# Patient Record
Sex: Female | Born: 1940
Health system: Southern US, Community
[De-identification: ages and names within clinical notes are randomized; demographics above are authoritative.]

## PROBLEM LIST (undated history)

## (undated) DIAGNOSIS — M5386 Other specified dorsopathies, lumbar region: Secondary | ICD-10-CM

## (undated) DIAGNOSIS — Z8619 Personal history of other infectious and parasitic diseases: Secondary | ICD-10-CM

## (undated) DIAGNOSIS — D649 Anemia, unspecified: Secondary | ICD-10-CM

## (undated) DIAGNOSIS — M1990 Unspecified osteoarthritis, unspecified site: Secondary | ICD-10-CM

## (undated) DIAGNOSIS — Z860100 Personal history of colon polyps, unspecified: Secondary | ICD-10-CM

## (undated) DIAGNOSIS — C649 Malignant neoplasm of unspecified kidney, except renal pelvis: Secondary | ICD-10-CM

## (undated) DIAGNOSIS — H269 Unspecified cataract: Secondary | ICD-10-CM

## (undated) DIAGNOSIS — M8580 Other specified disorders of bone density and structure, unspecified site: Secondary | ICD-10-CM

## (undated) HISTORY — DX: Other specified disorders of bone density and structure, unspecified site: M85.80

## (undated) HISTORY — DX: Malignant neoplasm of unspecified kidney, except renal pelvis: C64.9

## (undated) HISTORY — PX: TONSILLECTOMY: SUR1361

## (undated) HISTORY — PX: FRACTURE SURGERY: SHX138

## (undated) HISTORY — PX: TUBAL LIGATION: SHX77

## (undated) HISTORY — DX: Unspecified cataract: H26.9

## (undated) HISTORY — PX: DIAGNOSTIC LAPAROSCOPY: SUR761

---

## 2000-04-29 ENCOUNTER — Encounter: Admission: RE | Admit: 2000-04-29 | Discharge: 2000-04-29 | Payer: Self-pay | Admitting: Obstetrics and Gynecology

## 2000-04-29 ENCOUNTER — Encounter: Payer: Self-pay | Admitting: Obstetrics and Gynecology

## 2005-06-21 ENCOUNTER — Ambulatory Visit: Payer: Self-pay | Admitting: Family Medicine

## 2005-07-04 ENCOUNTER — Ambulatory Visit: Payer: Self-pay | Admitting: Family Medicine

## 2005-07-13 ENCOUNTER — Ambulatory Visit: Payer: Self-pay | Admitting: Family Medicine

## 2005-08-03 ENCOUNTER — Ambulatory Visit: Payer: Self-pay | Admitting: Family Medicine

## 2006-01-21 ENCOUNTER — Ambulatory Visit: Payer: Self-pay | Admitting: Unknown Physician Specialty

## 2006-12-25 ENCOUNTER — Ambulatory Visit: Payer: Self-pay | Admitting: Family Medicine

## 2006-12-25 LAB — CONVERTED CEMR LAB
ALT: 19 units/L (ref 0–40)
AST: 27 units/L (ref 0–37)
Albumin: 4.2 g/dL (ref 3.5–5.2)
Alkaline Phosphatase: 64 units/L (ref 39–117)
BUN: 19 mg/dL (ref 6–23)
Basophils Absolute: 0 10*3/uL (ref 0.0–0.1)
Basophils Relative: 0.2 % (ref 0.0–1.0)
Bilirubin, Direct: 0.1 mg/dL (ref 0.0–0.3)
CO2: 30 meq/L (ref 19–32)
Calcium: 9.4 mg/dL (ref 8.4–10.5)
Chloride: 99 meq/L (ref 96–112)
Cholesterol: 225 mg/dL (ref 0–200)
Creatinine, Ser: 0.9 mg/dL (ref 0.4–1.2)
Direct LDL: 121.1 mg/dL
Eosinophils Absolute: 0.2 10*3/uL (ref 0.0–0.6)
Eosinophils Relative: 3 % (ref 0.0–5.0)
GFR calc Af Amer: 81 mL/min
GFR calc non Af Amer: 67 mL/min
Glucose, Bld: 91 mg/dL (ref 70–99)
HCT: 39.4 % (ref 36.0–46.0)
HDL: 81.3 mg/dL (ref >39.0)
Hemoglobin: 13.4 g/dL (ref 12.0–15.0)
Lymphocytes Relative: 38.2 % (ref 12.0–46.0)
MCHC: 34 g/dL (ref 30.0–36.0)
MCV: 93.7 fL (ref 78.0–100.0)
Monocytes Absolute: 0.7 10*3/uL (ref 0.2–0.7)
Monocytes Relative: 9.2 % (ref 3.0–11.0)
Neutro Abs: 3.6 10*3/uL (ref 1.4–7.7)
Neutrophils Relative %: 49.4 % (ref 43.0–77.0)
Platelets: 391 10*3/uL (ref 150–400)
Potassium: 4.2 meq/L (ref 3.5–5.1)
RBC: 4.21 M/uL (ref 3.87–5.11)
RDW: 11.2 % — ABNORMAL LOW (ref 11.5–14.6)
Sodium: 138 meq/L (ref 135–145)
TSH: 1.84 microintl units/mL (ref 0.35–5.50)
Total Bilirubin: 1.1 mg/dL (ref 0.3–1.2)
Total CHOL/HDL Ratio: 2.8
Total Protein: 6.8 g/dL (ref 6.0–8.3)
Triglycerides: 51 mg/dL (ref 0–149)
VLDL: 10 mg/dL (ref 0–40)
WBC: 7.3 10*3/uL (ref 4.5–10.5)

## 2007-04-02 ENCOUNTER — Encounter (INDEPENDENT_AMBULATORY_CARE_PROVIDER_SITE_OTHER): Payer: Self-pay | Admitting: *Deleted

## 2008-03-29 ENCOUNTER — Encounter: Payer: Self-pay | Admitting: Family Medicine

## 2008-04-02 ENCOUNTER — Encounter (INDEPENDENT_AMBULATORY_CARE_PROVIDER_SITE_OTHER): Payer: Self-pay | Admitting: *Deleted

## 2009-04-01 ENCOUNTER — Encounter: Payer: Self-pay | Admitting: Family Medicine

## 2009-04-12 ENCOUNTER — Encounter: Payer: Self-pay | Admitting: Family Medicine

## 2009-04-13 ENCOUNTER — Encounter (INDEPENDENT_AMBULATORY_CARE_PROVIDER_SITE_OTHER): Payer: Self-pay | Admitting: *Deleted

## 2010-05-02 ENCOUNTER — Encounter: Payer: Self-pay | Admitting: Family Medicine

## 2010-05-05 ENCOUNTER — Encounter: Payer: Self-pay | Admitting: Family Medicine

## 2010-11-14 NOTE — Letter (Signed)
Summary: Results Follow up Letter  [place] [facility]  [address] [place], [place] [address]   Phone: [phone]  Fax: [phone]    05/05/2010 MRN: [id number]    MARITSSA HAUGHTON [address] [place], [place]  [address]    Dear Ms. Pischke,  The following are the results of your recent test(s):  Test         Result    Pap Smear:        Normal _____  Not Normal _____ Comments: ______________________________________________________ Cholesterol: LDL(Bad cholesterol):         Your goal is less than:         HDL (Good cholesterol):       Your goal is more than: Comments:  ______________________________________________________ Mammogram:        Normal ___X__  Not Normal _____ Comments:Repeat in one year.   ___________________________________________________________________ Hemoccult:        Normal _____  Not normal _______ Comments:    _____________________________________________________________________ Other Tests:    We routinely do not discuss normal results over the telephone.  If you desire a copy of the results, or you have any questions about this information we can discuss them at your next office visit.   Sincerely,    Idamae Schuller Tower,MD  MT/ri

## 2010-11-14 NOTE — Miscellaneous (Signed)
Summary: mammogram screening  Clinical Lists Changes  Observations: Added new observation of MAMMO DUE: 05/2011 (05/05/2010 10:45) Added new observation of MAMMOGRAM: normal (05/02/2010 10:46)      Preventive Care Screening  Mammogram:    Date:  05/02/2010    Next Due:  05/2011    Results:  normal

## 2011-04-26 ENCOUNTER — Ambulatory Visit: Payer: Self-pay | Admitting: Unknown Physician Specialty

## 2011-04-27 LAB — PATHOLOGY REPORT

## 2011-07-03 LAB — HM MAMMOGRAPHY: HM Mammogram: NORMAL

## 2011-07-04 ENCOUNTER — Encounter: Payer: Self-pay | Admitting: Family Medicine

## 2011-07-09 ENCOUNTER — Encounter: Payer: Self-pay | Admitting: Family Medicine

## 2011-07-20 ENCOUNTER — Encounter: Payer: Self-pay | Admitting: Family Medicine

## 2011-07-23 ENCOUNTER — Ambulatory Visit: Payer: Self-pay | Admitting: Family Medicine

## 2011-07-30 ENCOUNTER — Encounter: Payer: Self-pay | Admitting: Family Medicine

## 2011-07-30 ENCOUNTER — Ambulatory Visit: Payer: Self-pay | Admitting: Family Medicine

## 2011-07-31 ENCOUNTER — Ambulatory Visit (INDEPENDENT_AMBULATORY_CARE_PROVIDER_SITE_OTHER): Payer: Medicare Other | Admitting: Family Medicine

## 2011-07-31 ENCOUNTER — Encounter: Payer: Self-pay | Admitting: Family Medicine

## 2011-07-31 VITALS — BP 124/64 | HR 64 | Temp 97.6°F | Ht 60.0 in | Wt 112.8 lb

## 2011-07-31 DIAGNOSIS — M899 Disorder of bone, unspecified: Secondary | ICD-10-CM

## 2011-07-31 DIAGNOSIS — M8580 Other specified disorders of bone density and structure, unspecified site: Secondary | ICD-10-CM | POA: Insufficient documentation

## 2011-07-31 DIAGNOSIS — Z23 Encounter for immunization: Secondary | ICD-10-CM

## 2011-07-31 MED ORDER — ALENDRONATE SODIUM 70 MG PO TABS: 70.0000 mg | ORAL_TABLET | ORAL | Status: DC

## 2011-07-31 NOTE — Assessment & Plan Note (Signed)
Long disc about osteopenia and tx options and fracture risk  Pt given handout on OP  Has fam hx/ petite frame/ caucasian - but no hx of fx Will inc ca plus D to bid  Check D level in future  Start fosamax weekly- disc poss side eff in detail and will update dexa in 2 y  Disc safety issues   (also flu shot today)

## 2011-07-31 NOTE — Progress Notes (Signed)
  Subjective:    Patient ID: Christina Henry, female    DOB: 1941-04-18, 70 y.o.   MRN: [id number]  HPI Here to disc dexa and osteopenia  Had lowest score on dexa as -1.9 of R FN , and this was prev -1.7 Report does not give T scores for spine but states that bone density is down 8.2 % in spine   Is petite and caucasian   Ca- is very good with that 600 mg   D - takes 400 iu of that  Tried to take more - and stopped  Is outdoors a lot   Exercise - has exercised all her life - walking and golfing and raking leaves   fam hx - sister may have OP  fx hx - no fx at all in her life   She does have stiffness in back and hips lately - this does not slow her down  occ aleve   No GI problems  No hx of jaw tumors  Had a crown put on - no major dental work   Will be following up in [place] - for her PE  Pap-- since Dr Elana Alm retired  Brought old records to review   Patient Active Problem List  Diagnoses  . Osteopenia   Past Medical History  Diagnosis Date  . Osteopenia    No past surgical history on file. History  Substance Use Topics  . Smoking status: Never Smoker   . Smokeless tobacco: Not on file  . Alcohol Use: Not on file   No family history on file. Allergies  Allergen Reactions  . Demerol Nausea Only   No current outpatient prescriptions on file prior to visit.        Review of Systems Review of Systems  Constitutional: Negative for fever, appetite change, fatigue and unexpected weight change.  Eyes: Negative for pain and visual disturbance.  Respiratory: Negative for cough and shortness of breath.   Cardiovascular: Negative for cp or palpitations    Gastrointestinal: Negative for nausea, diarrhea and constipation.  Genitourinary: Negative for urgency and frequency.  Skin: Negative for pallor or rash   Neurological: Negative for weakness, light-headedness, numbness and headaches.  Hematological: Negative for adenopathy. Does not bruise/bleed easily.    Psychiatric/Behavioral: Negative for dysphoric mood. The patient is not nervous/anxious.          Objective:   Physical Exam  Constitutional: She appears well-developed and well-nourished. No distress.  HENT:  Head: Normocephalic and atraumatic.  Mouth/Throat: Oropharynx is clear and moist.  Eyes: Conjunctivae and EOM are normal. Pupils are equal, round, and reactive to light.  Neck: Normal range of motion. Neck supple. No thyromegaly present.  Cardiovascular: Normal rate, regular rhythm and normal heart sounds.   Pulmonary/Chest: Effort normal and breath sounds normal.  Musculoskeletal: She exhibits no edema.       No kyphosis or acute joint changes   Lymphadenopathy:    She has no cervical adenopathy.  Neurological: She is alert. She has normal reflexes. Coordination normal.  Skin: Skin is warm and dry. No rash noted. No erythema. No pallor.  Psychiatric: She has a normal mood and affect.          Assessment & Plan:

## 2011-07-31 NOTE — Patient Instructions (Addendum)
Start fosamax once weekly If any problems with it - like reflux stop it and call to let me know Increase your ca plus D to twice daily Keep exercising !

## 2011-08-07 ENCOUNTER — Encounter: Payer: Self-pay | Admitting: Family Medicine

## 2011-10-31 ENCOUNTER — Telehealth: Payer: Self-pay | Admitting: Family Medicine

## 2011-10-31 ENCOUNTER — Other Ambulatory Visit (INDEPENDENT_AMBULATORY_CARE_PROVIDER_SITE_OTHER): Payer: Medicare Other

## 2011-10-31 DIAGNOSIS — M899 Disorder of bone, unspecified: Secondary | ICD-10-CM

## 2011-10-31 DIAGNOSIS — M949 Disorder of cartilage, unspecified: Secondary | ICD-10-CM

## 2011-10-31 DIAGNOSIS — Z79899 Other long term (current) drug therapy: Secondary | ICD-10-CM

## 2011-10-31 DIAGNOSIS — Z0000 Encounter for general adult medical examination without abnormal findings: Secondary | ICD-10-CM

## 2011-10-31 DIAGNOSIS — M8580 Other specified disorders of bone density and structure, unspecified site: Secondary | ICD-10-CM

## 2011-10-31 LAB — LDL CHOLESTEROL, DIRECT: Direct LDL: 117.8 mg/dL

## 2011-10-31 LAB — LIPID PANEL
Cholesterol: 228 mg/dL — ABNORMAL HIGH (ref 0–200)
HDL: 94.9 mg/dL
Total CHOL/HDL Ratio: 2
Triglycerides: 70 mg/dL (ref 0.0–149.0)
VLDL: 14 mg/dL (ref 0.0–40.0)

## 2011-10-31 LAB — CBC WITH DIFFERENTIAL/PLATELET
Basophils Absolute: 0 K/uL (ref 0.0–0.1)
Basophils Relative: 0.7 % (ref 0.0–3.0)
Eosinophils Absolute: 0.3 K/uL (ref 0.0–0.7)
Eosinophils Relative: 4.3 % (ref 0.0–5.0)
HCT: 39.2 % (ref 36.0–46.0)
Hemoglobin: 13.3 g/dL (ref 12.0–15.0)
Lymphocytes Relative: 35 % (ref 12.0–46.0)
Lymphs Abs: 2.5 K/uL (ref 0.7–4.0)
MCHC: 33.9 g/dL (ref 30.0–36.0)
MCV: 92.1 fl (ref 78.0–100.0)
Monocytes Absolute: 0.6 K/uL (ref 0.1–1.0)
Monocytes Relative: 9.1 % (ref 3.0–12.0)
Neutro Abs: 3.6 K/uL (ref 1.4–7.7)
Neutrophils Relative %: 50.9 % (ref 43.0–77.0)
Platelets: 293 K/uL (ref 150.0–400.0)
RBC: 4.25 Mil/uL (ref 3.87–5.11)
RDW: 12.7 % (ref 11.5–14.6)
WBC: 7.1 K/uL (ref 4.5–10.5)

## 2011-10-31 LAB — COMPREHENSIVE METABOLIC PANEL WITH GFR
ALT: 17 U/L (ref 0–35)
AST: 20 U/L (ref 0–37)
Albumin: 4.7 g/dL (ref 3.5–5.2)
Alkaline Phosphatase: 52 U/L (ref 39–117)
BUN: 19 mg/dL (ref 6–23)
CO2: 28 meq/L (ref 19–32)
Calcium: 9.7 mg/dL (ref 8.4–10.5)
Chloride: 103 meq/L (ref 96–112)
Creatinine, Ser: 0.9 mg/dL (ref 0.4–1.2)
GFR: 63.32 mL/min
Glucose, Bld: 93 mg/dL (ref 70–99)
Potassium: 4.6 meq/L (ref 3.5–5.1)
Sodium: 139 meq/L (ref 135–145)
Total Bilirubin: 0.8 mg/dL (ref 0.3–1.2)
Total Protein: 7.3 g/dL (ref 6.0–8.3)

## 2011-10-31 LAB — TSH: TSH: 2.51 u[IU]/mL (ref 0.35–5.50)

## 2011-10-31 NOTE — Telephone Encounter (Signed)
Message copied by Judy Pimple on Wed Oct 31, 2011  8:06 AM ------      Message from: Baldomero Lamy      Created: Wed Oct 24, 2011  8:11 AM      Regarding: cpx labs Wed 10/31/11       Please order  future cpx labs for pt's upcomming lab appt.      Thanks      Rodney Booze

## 2011-10-31 NOTE — Progress Notes (Signed)
Addended by: Baldomero Lamy on: 10/31/2011 08:33 AM   Modules accepted: Orders

## 2011-11-07 ENCOUNTER — Ambulatory Visit (INDEPENDENT_AMBULATORY_CARE_PROVIDER_SITE_OTHER): Payer: Medicare Other | Admitting: Family Medicine

## 2011-11-07 ENCOUNTER — Encounter: Payer: Self-pay | Admitting: Family Medicine

## 2011-11-07 VITALS — BP 134/68 | HR 72 | Temp 98.3°F | Ht 59.75 in | Wt 114.8 lb

## 2011-11-07 DIAGNOSIS — M949 Disorder of cartilage, unspecified: Secondary | ICD-10-CM

## 2011-11-07 DIAGNOSIS — Z23 Encounter for immunization: Secondary | ICD-10-CM

## 2011-11-07 DIAGNOSIS — M899 Disorder of bone, unspecified: Secondary | ICD-10-CM

## 2011-11-07 DIAGNOSIS — M8580 Other specified disorders of bone density and structure, unspecified site: Secondary | ICD-10-CM

## 2011-11-07 DIAGNOSIS — Z0000 Encounter for general adult medical examination without abnormal findings: Secondary | ICD-10-CM

## 2011-11-07 NOTE — Progress Notes (Signed)
Subjective:    Patient ID: Christina Henry, female    DOB: 02-24-1941, 71 y.o.   MRN: [id number]  HPI Here for check up of chronic medical conditions and to review health mt list   Is feeling good  Decided to quit taking the generic fosamax   Wt is up 2 lb with bmi of 22  134/68 stable bp   Osteopenia  dexa 9/12 - decrease in spine score - hip stable  On fosamax- then stopped it  Due to fear of side effects , was on it for 2 months  Lumbar spine score did go down  Is exercising some - got her silver sneakers card - and is going today to start it  Vit D level good at 43  Lipids ok Lab Results  Component Value Date   CHOL 228* 10/31/2011   HDL 94.90 10/31/2011   LDLDIRECT 117.8 10/31/2011   TRIG 70.0 10/31/2011   CHOLHDL 2 10/31/2011    Pap was 8/11 normal No gyn problems  No hpv  No new partners   Mam 9/12 normal Self exam - no lumps or problems   Tdap - ? Did at health dept -- over 10 years ago , will go to health dept  Zoster status- had shingles back in 1990s , light case  Pneumovax - wants to get today  Had flu shot  colonosc 7/12- came out ok   Patient Active Problem List  Diagnoses  . Osteopenia  . Routine general medical examination at a health care facility   Past Medical History  Diagnosis Date  . Osteopenia    No past surgical history on file. History  Substance Use Topics  . Smoking status: Never Smoker   . Smokeless tobacco: Not on file  . Alcohol Use: Not on file   No family history on file. Allergies  Allergen Reactions  . Demerol Nausea Only   Current Outpatient Prescriptions on File Prior to Visit  Medication Sig Dispense Refill  . alendronate (FOSAMAX) 70 MG tablet Take 1 tablet (70 mg total) by mouth every 7 (seven) days. Take with a full glass of water on an empty stomach.  4 tablet  11  . Calcium Carbonate-Vit D-Min 600-400 MG-UNIT TABS Take 1 tablet by mouth 2 (two) times daily.           Review of Systems Review of Systems    Constitutional: Negative for fever, appetite change, fatigue and unexpected weight change.  Eyes: Negative for pain and visual disturbance.  Respiratory: Negative for cough and shortness of breath.   Cardiovascular: Negative for cp or palpitations    Gastrointestinal: Negative for nausea, diarrhea and constipation.  Genitourinary: Negative for urgency and frequency.  Skin: Negative for pallor or rash   Neurological: Negative for weakness, light-headedness, numbness and headaches.  Hematological: Negative for adenopathy. Does not bruise/bleed easily.  Psychiatric/Behavioral: Negative for dysphoric mood. The patient is not nervous/anxious.          Objective:   Physical Exam  Constitutional: She appears well-developed and well-nourished. No distress.  HENT:  Head: Normocephalic and atraumatic.  Right Ear: External ear normal.  Left Ear: External ear normal.  Nose: Nose normal.  Mouth/Throat: Oropharynx is clear and moist.  Eyes: Conjunctivae and EOM are normal. Pupils are equal, round, and reactive to light. No scleral icterus.  Neck: Normal range of motion. Neck supple. No JVD present. Carotid bruit is not present. No thyromegaly present.  Cardiovascular: Normal rate, regular rhythm, normal heart  sounds and intact distal pulses.  Exam reveals no gallop.   Pulmonary/Chest: Effort normal and breath sounds normal. No respiratory distress. She has no wheezes.  Abdominal: Soft. Bowel sounds are normal. She exhibits no distension, no abdominal bruit and no mass. There is no tenderness.  Genitourinary: No breast swelling, tenderness, discharge or bleeding.       Breast exam: No mass, nodules, thickening, tenderness, bulging, retraction, inflamation, nipple discharge or skin changes noted.  No axillary or clavicular LA.  Chaperoned exam.    Musculoskeletal: Normal range of motion. She exhibits no edema and no tenderness.       No kyphosis   Lymphadenopathy:    She has no cervical  adenopathy.  Neurological: She is alert. She has normal reflexes. No cranial nerve deficit. She exhibits normal muscle tone. Coordination normal.  Skin: Skin is warm and dry. No rash noted. No erythema. No pallor.  Psychiatric: She has a normal mood and affect.          Assessment & Plan:

## 2011-11-07 NOTE — Patient Instructions (Addendum)
If you are interested in shingles vaccine in future - call your insurance company to see how coverage is and call us to schedule  Pneumonia vaccine today Get your Tdap at the health department  Keep up healthy diet and exercise  Get back on fosamax if you are comfortable with that

## 2011-11-08 NOTE — Assessment & Plan Note (Signed)
Pt was encouraged to re start fosamax if no side eff or problems Disc pros and cons and risk of this incl jaw tumor Will aim for 5 y of tx Rev her dexa Disc fx risk and imp of ca and D D level is ok

## 2011-11-08 NOTE — Assessment & Plan Note (Signed)
Reviewed health habits including diet and exercise and skin cancer prevention Also reviewed health mt list, fam hx and immunizations  Rev wellness labs in detail  Will get her Tdap at health dept  Pneumovax today

## 2011-12-26 ENCOUNTER — Ambulatory Visit: Payer: Medicare Other

## 2012-01-17 ENCOUNTER — Telehealth: Payer: Self-pay | Admitting: Family Medicine

## 2012-01-17 NOTE — Telephone Encounter (Signed)
Caller: Christina Henry/Patient; PCP: Tower, Marne A.; CB#: [phone];  Call regarding Cough/Congestion; Pt is calling from [facility]. Sinus congestion and cough x 3 wks. Coughing up clear phlegm. Chest and abd are sore from coughing though no active CP. Mucinex D does relieve her sx for a few hours. Advised per URI Protocol. Call back parameters reviewed.

## 2012-07-30 ENCOUNTER — Encounter: Payer: Self-pay | Admitting: Family Medicine

## 2012-07-31 ENCOUNTER — Encounter: Payer: Self-pay | Admitting: *Deleted

## 2012-08-08 ENCOUNTER — Other Ambulatory Visit: Payer: Self-pay | Admitting: Family Medicine

## 2012-11-04 ENCOUNTER — Telehealth: Payer: Self-pay | Admitting: Family Medicine

## 2012-11-04 DIAGNOSIS — M8580 Other specified disorders of bone density and structure, unspecified site: Secondary | ICD-10-CM

## 2012-11-04 DIAGNOSIS — E785 Hyperlipidemia, unspecified: Secondary | ICD-10-CM

## 2012-11-04 NOTE — Telephone Encounter (Signed)
Message copied by Judy Pimple on Tue Nov 04, 2012  5:09 PM ------      Message from: Baldomero Lamy      Created: Wed Oct 29, 2012 10:21 AM      Regarding: Cpx labs Wed 1/22       Please order  future cpx labs for pt's upcoming lab appt.      Thanks      Rodney Booze

## 2012-11-05 ENCOUNTER — Other Ambulatory Visit: Payer: Self-pay | Admitting: Family Medicine

## 2012-11-05 ENCOUNTER — Other Ambulatory Visit (INDEPENDENT_AMBULATORY_CARE_PROVIDER_SITE_OTHER): Payer: Medicare Other

## 2012-11-05 DIAGNOSIS — M8580 Other specified disorders of bone density and structure, unspecified site: Secondary | ICD-10-CM

## 2012-11-05 DIAGNOSIS — E785 Hyperlipidemia, unspecified: Secondary | ICD-10-CM

## 2012-11-05 LAB — LIPID PANEL
Cholesterol: 213 mg/dL — ABNORMAL HIGH (ref 0–200)
HDL: 91 mg/dL (ref >39.00)
VLDL: 8.6 mg/dL (ref 0.0–40.0)

## 2012-11-05 LAB — COMPREHENSIVE METABOLIC PANEL
BUN: 20 mg/dL (ref 6–23)
CO2: 32 mEq/L (ref 19–32)
Creatinine, Ser: 0.9 mg/dL (ref 0.4–1.2)
GFR: 68.19 mL/min (ref >60.00)
Glucose, Bld: 102 mg/dL — ABNORMAL HIGH (ref 70–99)
Total Bilirubin: 0.9 mg/dL (ref 0.3–1.2)

## 2012-11-06 ENCOUNTER — Other Ambulatory Visit: Payer: Self-pay | Admitting: Family Medicine

## 2012-11-10 ENCOUNTER — Encounter: Payer: Self-pay | Admitting: Family Medicine

## 2012-11-10 ENCOUNTER — Ambulatory Visit (INDEPENDENT_AMBULATORY_CARE_PROVIDER_SITE_OTHER): Payer: Medicare Other | Admitting: Family Medicine

## 2012-11-10 VITALS — BP 142/72 | HR 65 | Temp 98.5°F | Ht 59.5 in | Wt 111.8 lb

## 2012-11-10 DIAGNOSIS — M949 Disorder of cartilage, unspecified: Secondary | ICD-10-CM

## 2012-11-10 DIAGNOSIS — E785 Hyperlipidemia, unspecified: Secondary | ICD-10-CM

## 2012-11-10 DIAGNOSIS — Z23 Encounter for immunization: Secondary | ICD-10-CM

## 2012-11-10 DIAGNOSIS — M8580 Other specified disorders of bone density and structure, unspecified site: Secondary | ICD-10-CM

## 2012-11-10 DIAGNOSIS — M899 Disorder of bone, unspecified: Secondary | ICD-10-CM

## 2012-11-10 NOTE — Patient Instructions (Addendum)
Tetanus shot today and also flu vaccine  Keep taking good care of yourself Go forward with the golf training - it may help your back  Hold your fosamax a month and see how your symptoms do

## 2012-11-10 NOTE — Progress Notes (Signed)
Subjective:    Patient ID: Christina Henry, female    DOB: 01-Sep-1941, 72 y.o.   MRN: [id number]  HPI Here for check up of chronic medical conditions and to review health mt list   Is doing well overall   For the past 4 months - more trouble with sciatica on the R side - pain rad down her leg with some tingling   Wt is down 3 lb with bmi of 22  Td-needs that - not around babies  Zoster status-unsure if she wants it - does not think her ins will cover it - may check that out  Flu vaccine -needs that today  mammo 1013 Self exam -no lumps or changes  Gyn- pap was in 2012 No problems at all  No hx of abn paps   colonosc 7/12  Osteopenia  On fosamax and due dexa 2/14 D level is 43 She is having some side effects from fosamax- constipation  Wonders about pain  Skin is itching -esp on L abdomen    Falls-none at all and no fx  Mood- has been ok overall , not depressed   Lab Results  Component Value Date   CHOL 213* 11/05/2012   CHOL 228* 10/31/2011   CHOL 225* 12/25/2006   Lab Results  Component Value Date   HDL 91.00 11/05/2012   HDL 40.98 10/31/2011   HDL 11.9 12/25/2006   No results found for this basename: [facility]   Lab Results  Component Value Date   TRIG 43.0 11/05/2012   TRIG 70.0 10/31/2011   TRIG 51 12/25/2006   Lab Results  Component Value Date   CHOLHDL 2 11/05/2012   CHOLHDL 2 10/31/2011   CHOLHDL 2.8 CALC 12/25/2006   Lab Results  Component Value Date   LDLDIRECT 92.7 11/05/2012   LDLDIRECT 117.8 10/31/2011   LDLDIRECT 121.1 12/25/2006    Very good today  She eats a healthy diet - she shops the perimeter of the store - healthy      Patient Active Problem List  Diagnosis  . Osteopenia  . Routine general medical examination at a health care facility  . Hyperlipidemia   Past Medical History  Diagnosis Date  . Osteopenia    No past surgical history on file. History  Substance Use Topics  . Smoking status: Never Smoker   . Smokeless tobacco:  Not on file  . Alcohol Use: Yes     Comment: wine daily   No family history on file. Allergies  Allergen Reactions  . Demerol Nausea Only  . Fosamax (Alendronate Sodium)     constipation   Current Outpatient Prescriptions on File Prior to Visit  Medication Sig Dispense Refill  . Calcium Carbonate-Vit D-Min 600-400 MG-UNIT TABS Take 1 tablet by mouth 2 (two) times daily.          Review of Systems Review of Systems  Constitutional: Negative for fever, appetite change, fatigue and unexpected weight change.  Eyes: Negative for pain and visual disturbance.  Respiratory: Negative for cough and shortness of breath.   Cardiovascular: Negative for cp or palpitations    Gastrointestinal: Negative for nausea, diarrhea and constipation.  Genitourinary: Negative for urgency and frequency.  Skin: Negative for pallor or rash   MSK pos for intermittent back pain  Neurological: Negative for weakness, light-headedness, numbness and headaches.  Hematological: Negative for adenopathy. Does not bruise/bleed easily.  Psychiatric/Behavioral: Negative for dysphoric mood. The patient is not nervous/anxious.  Objective:   Physical Exam  Constitutional: She appears well-developed and well-nourished. No distress.  HENT:  Head: Normocephalic and atraumatic.  Right Ear: External ear normal.  Left Ear: External ear normal.  Nose: Nose normal.  Mouth/Throat: Oropharynx is clear and moist.  Eyes: Conjunctivae normal and EOM are normal. Pupils are equal, round, and reactive to light. No scleral icterus.  Neck: Normal range of motion. Neck supple. No JVD present. No thyromegaly present.  Cardiovascular: Normal rate, regular rhythm, normal heart sounds and intact distal pulses.  Exam reveals no gallop.   Pulmonary/Chest: Effort normal and breath sounds normal. No respiratory distress. She has no wheezes. She exhibits no tenderness.  Abdominal: Soft. Bowel sounds are normal. She exhibits no  distension and no mass. There is no tenderness.  Genitourinary: No breast swelling, tenderness, discharge or bleeding.       Breast exam: No mass, nodules, thickening, tenderness, bulging, retraction, inflamation, nipple discharge or skin changes noted.  No axillary or clavicular LA.  Chaperoned exam.    Musculoskeletal: She exhibits no edema and no tenderness.  Lymphadenopathy:    She has no cervical adenopathy.  Neurological: She is alert. She has normal reflexes. No cranial nerve deficit. She exhibits normal muscle tone. Coordination normal.  Skin: Skin is warm and dry. No rash noted. No erythema. No pallor.  Psychiatric: She has a normal mood and affect.          Assessment & Plan:

## 2012-11-12 NOTE — Assessment & Plan Note (Signed)
Disc goals for lipids and reasons to control them Rev labs with pt Rev low sat fat diet in detail   

## 2012-11-12 NOTE — Assessment & Plan Note (Signed)
Intol of fosamax?- pt will hold it and report back re: symptoms Rev D and Ca intake and exercise No falls/ fx

## 2013-02-19 ENCOUNTER — Telehealth: Payer: Self-pay

## 2013-02-19 MED ORDER — ALENDRONATE SODIUM 70 MG PO TABS: 70.0000 mg | ORAL_TABLET | ORAL | Status: DC

## 2013-02-19 NOTE — Telephone Encounter (Signed)
That is fine, but stop it if side eff Will send electronically

## 2013-02-19 NOTE — Telephone Encounter (Signed)
Pt request to restart alendronate. [facility] [address]Please advise.

## 2013-02-19 NOTE — Telephone Encounter (Signed)
Nadine Counts (pt's emergency contact) notified Rx sent into pharmacy and let us know if any side eff

## 2013-06-17 ENCOUNTER — Ambulatory Visit: Payer: Self-pay | Admitting: Orthopedic Surgery

## 2013-07-23 ENCOUNTER — Ambulatory Visit: Payer: Medicare Other

## 2013-07-24 ENCOUNTER — Ambulatory Visit (INDEPENDENT_AMBULATORY_CARE_PROVIDER_SITE_OTHER): Payer: Medicare Other

## 2013-07-24 DIAGNOSIS — Z23 Encounter for immunization: Secondary | ICD-10-CM

## 2013-09-14 DIAGNOSIS — C649 Malignant neoplasm of unspecified kidney, except renal pelvis: Secondary | ICD-10-CM

## 2013-09-14 HISTORY — DX: Malignant neoplasm of unspecified kidney, except renal pelvis: C64.9

## 2013-09-14 HISTORY — PX: ROBOTIC ASSITED PARTIAL NEPHRECTOMY: SHX6087

## 2013-11-02 ENCOUNTER — Telehealth: Payer: Self-pay | Admitting: Family Medicine

## 2013-11-02 DIAGNOSIS — E785 Hyperlipidemia, unspecified: Secondary | ICD-10-CM

## 2013-11-02 DIAGNOSIS — M8580 Other specified disorders of bone density and structure, unspecified site: Secondary | ICD-10-CM

## 2013-11-02 DIAGNOSIS — Z0000 Encounter for general adult medical examination without abnormal findings: Secondary | ICD-10-CM | POA: Insufficient documentation

## 2013-11-02 NOTE — Telephone Encounter (Signed)
Message copied by Abner Greenspan on Mon Nov 02, 2013  9:23 PM ------      Message from: Ellamae Sia      Created: Thu Oct 22, 2013 11:36 AM      Regarding: Lab orders for Tuesday, 1.20.15       Patient is scheduled for CPX labs, please order future labs, Thanks , Terri       ------

## 2013-11-03 ENCOUNTER — Other Ambulatory Visit (INDEPENDENT_AMBULATORY_CARE_PROVIDER_SITE_OTHER): Payer: Medicare Other

## 2013-11-03 DIAGNOSIS — M8580 Other specified disorders of bone density and structure, unspecified site: Secondary | ICD-10-CM

## 2013-11-03 DIAGNOSIS — E785 Hyperlipidemia, unspecified: Secondary | ICD-10-CM

## 2013-11-03 DIAGNOSIS — Z0000 Encounter for general adult medical examination without abnormal findings: Secondary | ICD-10-CM

## 2013-11-03 LAB — CBC WITH DIFFERENTIAL/PLATELET
BASOS PCT: 1 % (ref 0.0–3.0)
Basophils Absolute: 0.1 10*3/uL (ref 0.0–0.1)
EOS PCT: 6.2 % — AB (ref 0.0–5.0)
Eosinophils Absolute: 0.4 10*3/uL (ref 0.0–0.7)
HCT: 34.8 % — ABNORMAL LOW (ref 36.0–46.0)
HEMOGLOBIN: 11.8 g/dL — AB (ref 12.0–15.0)
LYMPHS PCT: 31.8 % (ref 12.0–46.0)
Lymphs Abs: 2.3 10*3/uL (ref 0.7–4.0)
MCHC: 33.7 g/dL (ref 30.0–36.0)
MCV: 89.7 fl (ref 78.0–100.0)
MONOS PCT: 7.7 % (ref 3.0–12.0)
Monocytes Absolute: 0.5 10*3/uL (ref 0.1–1.0)
NEUTROS ABS: 3.8 10*3/uL (ref 1.4–7.7)
NEUTROS PCT: 53.3 % (ref 43.0–77.0)
Platelets: 496 10*3/uL — ABNORMAL HIGH (ref 150.0–400.0)
RBC: 3.89 Mil/uL (ref 3.87–5.11)
RDW: 12.5 % (ref 11.5–14.6)
WBC: 7.1 10*3/uL (ref 4.5–10.5)

## 2013-11-03 LAB — COMPREHENSIVE METABOLIC PANEL
ALBUMIN: 4.1 g/dL (ref 3.5–5.2)
ALT: 16 U/L (ref 0–35)
AST: 20 U/L (ref 0–37)
Alkaline Phosphatase: 50 U/L (ref 39–117)
BUN: 16 mg/dL (ref 6–23)
CALCIUM: 9.4 mg/dL (ref 8.4–10.5)
CHLORIDE: 104 meq/L (ref 96–112)
CO2: 30 meq/L (ref 19–32)
Creatinine, Ser: 1 mg/dL (ref 0.4–1.2)
GFR: 55.34 mL/min — AB (ref >60.00)
GLUCOSE: 86 mg/dL (ref 70–99)
POTASSIUM: 4.6 meq/L (ref 3.5–5.1)
Sodium: 139 mEq/L (ref 135–145)
Total Bilirubin: 0.6 mg/dL (ref 0.3–1.2)
Total Protein: 7.1 g/dL (ref 6.0–8.3)

## 2013-11-03 LAB — LIPID PANEL
CHOLESTEROL: 203 mg/dL — AB (ref 0–200)
HDL: 70.5 mg/dL (ref >39.00)
TRIGLYCERIDES: 45 mg/dL (ref 0.0–149.0)
Total CHOL/HDL Ratio: 3
VLDL: 9 mg/dL (ref 0.0–40.0)

## 2013-11-03 LAB — TSH: TSH: 3.13 u[IU]/mL (ref 0.35–5.50)

## 2013-11-03 LAB — LDL CHOLESTEROL, DIRECT: Direct LDL: 122.4 mg/dL

## 2013-11-04 LAB — VITAMIN D 25 HYDROXY (VIT D DEFICIENCY, FRACTURES): Vit D, 25-Hydroxy: 46 ng/mL (ref 30–89)

## 2013-11-10 ENCOUNTER — Encounter: Payer: Self-pay | Admitting: Family Medicine

## 2013-11-10 ENCOUNTER — Ambulatory Visit (INDEPENDENT_AMBULATORY_CARE_PROVIDER_SITE_OTHER): Payer: Medicare Other | Admitting: Family Medicine

## 2013-11-10 VITALS — BP 122/68 | HR 70 | Temp 97.6°F | Ht 59.5 in | Wt 112.0 lb

## 2013-11-10 DIAGNOSIS — M5430 Sciatica, unspecified side: Secondary | ICD-10-CM

## 2013-11-10 DIAGNOSIS — E785 Hyperlipidemia, unspecified: Secondary | ICD-10-CM

## 2013-11-10 DIAGNOSIS — Z0000 Encounter for general adult medical examination without abnormal findings: Secondary | ICD-10-CM

## 2013-11-10 DIAGNOSIS — M8580 Other specified disorders of bone density and structure, unspecified site: Secondary | ICD-10-CM

## 2013-11-10 DIAGNOSIS — M949 Disorder of cartilage, unspecified: Secondary | ICD-10-CM

## 2013-11-10 DIAGNOSIS — M899 Disorder of bone, unspecified: Secondary | ICD-10-CM

## 2013-11-10 DIAGNOSIS — M5386 Other specified dorsopathies, lumbar region: Secondary | ICD-10-CM

## 2013-11-10 LAB — HM DEXA SCAN

## 2013-11-10 MED ORDER — ALENDRONATE SODIUM 70 MG PO TABS: 70.0000 mg | ORAL_TABLET | ORAL | Status: DC

## 2013-11-10 MED ORDER — GABAPENTIN 100 MG PO CAPS: 100.0000 mg | ORAL_CAPSULE | Freq: Three times a day (TID) | ORAL | Status: DC

## 2013-11-10 NOTE — Patient Instructions (Signed)
Here are back exercises - if you want a physical therapy referral in the future please let me know  Don't forget to schedule your mammogram  Stop up front for bone density test referral

## 2013-11-10 NOTE — Progress Notes (Signed)
Subjective:    Patient ID: Christina Henry, female    DOB: 30-Aug-1941, 73 y.o.   MRN: [id number]  HPI I have personally reviewed the Medicare Annual Wellness questionnaire and have noted 1. The patient's medical and social history 2. Their use of alcohol, tobacco or illicit drugs 3. Their current medications and supplements 4. The patient's functional ability including ADL's, fall risks, home safety risks and hearing or visual             impairment. 5. Diet and physical activities 6. Evidence for depression or mood disorders  The patients weight, height, BMI have been recorded in the chart and visual acuity is per eye clinic.  I have made referrals, counseling and provided education to the patient based review of the above and I have provided the pt with a written personalized care plan for preventive services.  See scanned forms.  Routine anticipatory guidance given to patient.  See health maintenance. Flu shot 10/14 Shingles - may be interested in the vaccine / does not think her ins covers it / may pay out of pocket  PNA vaccine 1/13 Tetanus vaccine 1/14 Colonoscopy 7/12  Breast cancer screening- mammogram 10/13 - missed it due to her cancer tx  Self exam -no lumps  Advance directive - she does have a living will  Cognitive function addressed- see scanned forms- and if abnormal then additional documentation follows.  No major memory concerns   PMH and SH reviewed  Meds, vitals, and allergies reviewed.   ROS: See HPI.  Otherwise negative.    Had renal clear cell carcinoma -partial nephrectomy at [facility] This went very well and no other therapy required at all  Has f/u in a year   Lab Results  Component Value Date   WBC 7.1 11/03/2013   HGB 11.8* 11/03/2013   HCT 34.8* 11/03/2013   MCV 89.7 11/03/2013   PLT 496.0* 11/03/2013   slt anemia  Was not eating well for a while -now appetite is back   Osteopenia dexa 9/12 D level is 46 Fosamax-still on with no problems   Wt  is stable  Plays golf and exercises  Not a big eater   Has chronic sciatica Takes gabapentin -helpful  Went to orthopedic  [facility]     Chemistry      Component Value Date/Time   NA 139 11/03/2013 0827   K 4.6 11/03/2013 0827   CL 104 11/03/2013 0827   CO2 30 11/03/2013 0827   BUN 16 11/03/2013 0827   CREATININE 1.0 11/03/2013 0827      Component Value Date/Time   CALCIUM 9.4 11/03/2013 0827   ALKPHOS 50 11/03/2013 0827   AST 20 11/03/2013 0827   ALT 16 11/03/2013 0827   BILITOT 0.6 11/03/2013 0827      Lab Results  Component Value Date   TSH 3.13 11/03/2013   Lab Results  Component Value Date   CHOL 203* 11/03/2013   CHOL 213* 11/05/2012   CHOL 228* 10/31/2011   Lab Results  Component Value Date   HDL 70.50 11/03/2013   HDL 91.00 11/05/2012   HDL 94.90 10/31/2011   No results found for this basename: [facility]   Lab Results  Component Value Date   TRIG 45.0 11/03/2013   TRIG 43.0 11/05/2012   TRIG 70.0 10/31/2011   Lab Results  Component Value Date   CHOLHDL 3 11/03/2013   CHOLHDL 2 11/05/2012  CHOLHDL 2 10/31/2011   Lab Results  Component Value Date   LDLDIRECT 122.4 11/03/2013   LDLDIRECT 92.7 11/05/2012   LDLDIRECT 117.8 10/31/2011   LDL is up a bit    Patient Active Problem List   Diagnosis Date Noted  . Sciatica associated with disorder of lumbar spine 11/10/2013  . Encounter for Medicare annual wellness exam 11/02/2013  . Hyperlipidemia 11/04/2012  . Routine general medical examination at a health care facility 10/31/2011  . Osteopenia 07/31/2011   Past Medical History  Diagnosis Date  . Osteopenia   . Renal cell carcinoma 12/14    clear cell/ partial nephrectomy   Past Surgical History  Procedure Laterality Date  . Robotic assited partial nephrectomy  12/14    renal clear cell carcinoma [facility]   History  Substance Use Topics  . Smoking status: Never Smoker   . Smokeless tobacco: Not on file  . Alcohol Use: Yes       Comment: wine daily   No family history on file. Allergies  Allergen Reactions  . Demerol Nausea Only   Current Outpatient Prescriptions on File Prior to Visit  Medication Sig Dispense Refill  . Biotin 1000 MCG tablet Take 1,000 mcg by mouth 2 (two) times daily.      . Calcium Carbonate-Vit D-Min 600-400 MG-UNIT TABS Take 1 tablet by mouth 2 (two) times daily.        No current facility-administered medications on file prior to visit.    Review of Systems Review of Systems  Constitutional: Negative for fever, appetite change, fatigue and unexpected weight change.  Eyes: Negative for pain and visual disturbance.  Respiratory: Negative for cough and shortness of breath.   Cardiovascular: Negative for cp or palpitations    Gastrointestinal: Negative for nausea, diarrhea and constipation.  Genitourinary: Negative for urgency and frequency.  Skin: Negative for pallor or rash   MSK pos for sciatica/ back and leg pain  Neurological: Negative for weakness, light-headedness, numbness and headaches.  Hematological: Negative for adenopathy. Does not bruise/bleed easily.  Psychiatric/Behavioral: Negative for dysphoric mood. The patient is not nervous/anxious.         Objective:   Physical Exam  Constitutional: She appears well-developed and well-nourished. No distress.  HENT:  Head: Normocephalic and atraumatic.  Right Ear: External ear normal.  Left Ear: External ear normal.  Mouth/Throat: Oropharynx is clear and moist.  Eyes: Conjunctivae and EOM are normal. Pupils are equal, round, and reactive to light. No scleral icterus.  Neck: Normal range of motion. Neck supple. No JVD present. Carotid bruit is not present. No thyromegaly present.  Cardiovascular: Normal rate, regular rhythm, normal heart sounds and intact distal pulses.  Exam reveals no gallop.   Pulmonary/Chest: Effort normal and breath sounds normal. No respiratory distress. She has no wheezes. She exhibits no tenderness.   Abdominal: Soft. Bowel sounds are normal. She exhibits no distension, no abdominal bruit and no mass. There is no tenderness.  Several laparoscopy scars-healing well   Genitourinary: No breast swelling, tenderness, discharge or bleeding.  Breast exam: No mass, nodules, thickening, tenderness, bulging, retraction, inflamation, nipple discharge or skin changes noted.  No axillary or clavicular LA.    Musculoskeletal: Normal range of motion. She exhibits no edema and no tenderness.  Lymphadenopathy:    She has no cervical adenopathy.  Neurological: She is alert. She has normal reflexes. No cranial nerve deficit. She exhibits normal muscle tone. Coordination normal.  Skin: Skin is warm and dry. No rash noted. No  erythema. No pallor.  Psychiatric: She has a normal mood and affect.          Assessment & Plan:

## 2013-11-10 NOTE — Progress Notes (Signed)
Pre-visit discussion using our clinic review tool. No additional management support is needed unless otherwise documented below in the visit note.  

## 2013-11-11 NOTE — Assessment & Plan Note (Signed)
Pt has had chiropractic tx/ ortho/ PT Would like some exercises to do at home  Printed these out and rev  She is anxious to begin exercise

## 2013-11-11 NOTE — Assessment & Plan Note (Signed)
Will schedule follow up dexa  On fosamax - tolerating that  Disc need for calcium/ vitamin D/ wt bearing exercise and bone density test every 2 y to monitor Disc safety/ fracture risk in detail

## 2013-11-11 NOTE — Assessment & Plan Note (Addendum)
Disc goals for lipids and reasons to control them Rev labs with pt- from last check  Rev low sat fat diet in detail  

## 2013-11-11 NOTE — Assessment & Plan Note (Signed)
Reviewed health habits including diet and exercise and skin cancer prevention Reviewed appropriate screening tests for age  Also reviewed health mt list, fam hx and immunization status , as well as social and family history   Labs reviewed  

## 2013-11-19 ENCOUNTER — Encounter: Payer: Self-pay | Admitting: Family Medicine

## 2013-11-24 ENCOUNTER — Telehealth: Payer: Self-pay

## 2013-11-24 NOTE — Telephone Encounter (Signed)
Pt left vm returning call. °

## 2013-11-24 NOTE — Telephone Encounter (Signed)
Pt left v/m requesting cb about shingles vaccine. Left message for pt to cb.

## 2013-11-25 ENCOUNTER — Encounter: Payer: Self-pay | Admitting: Family Medicine

## 2013-11-26 ENCOUNTER — Telehealth: Payer: Self-pay | Admitting: Family Medicine

## 2013-11-26 NOTE — Telephone Encounter (Signed)
Adding dexa result to health mt

## 2013-11-27 MED ORDER — ZOSTER VACCINE LIVE 19400 UNT/0.65ML ~~LOC~~ SOLR: 0.6500 mL | Freq: Once | SUBCUTANEOUS | Status: DC

## 2013-11-27 NOTE — Telephone Encounter (Signed)
Left voicemail letting pt know vaccine sent to pharmacy

## 2013-11-27 NOTE — Telephone Encounter (Signed)
Pt request rx for shingles vaccine sent to [facility]. Pt request cb when done.

## 2013-11-27 NOTE — Telephone Encounter (Signed)
I sent it  

## 2014-01-16 ENCOUNTER — Other Ambulatory Visit: Payer: Self-pay | Admitting: Family Medicine

## 2014-08-27 ENCOUNTER — Ambulatory Visit (INDEPENDENT_AMBULATORY_CARE_PROVIDER_SITE_OTHER): Payer: Medicare Other

## 2014-08-27 DIAGNOSIS — Z23 Encounter for immunization: Secondary | ICD-10-CM

## 2014-11-02 ENCOUNTER — Telehealth: Payer: Self-pay | Admitting: Family Medicine

## 2014-11-02 DIAGNOSIS — E785 Hyperlipidemia, unspecified: Secondary | ICD-10-CM

## 2014-11-02 DIAGNOSIS — Z0000 Encounter for general adult medical examination without abnormal findings: Secondary | ICD-10-CM

## 2014-11-02 DIAGNOSIS — M8580 Other specified disorders of bone density and structure, unspecified site: Secondary | ICD-10-CM

## 2014-11-02 NOTE — Telephone Encounter (Signed)
-----   Message from Ellamae Sia sent at 10/28/2014  2:39 PM EST ----- Regarding: Lab orders for Wednesday, 1.20.16 Patient is scheduled for CPX labs, please order future labs, Thanks , Karna Christmas

## 2014-11-03 ENCOUNTER — Other Ambulatory Visit (INDEPENDENT_AMBULATORY_CARE_PROVIDER_SITE_OTHER): Payer: Medicare Other

## 2014-11-03 DIAGNOSIS — Z0000 Encounter for general adult medical examination without abnormal findings: Secondary | ICD-10-CM

## 2014-11-03 DIAGNOSIS — E785 Hyperlipidemia, unspecified: Secondary | ICD-10-CM

## 2014-11-03 DIAGNOSIS — M8580 Other specified disorders of bone density and structure, unspecified site: Secondary | ICD-10-CM

## 2014-11-03 LAB — LIPID PANEL
CHOL/HDL RATIO: 3
Cholesterol: 212 mg/dL — ABNORMAL HIGH (ref 0–200)
HDL: 84.7 mg/dL (ref >39.00)
LDL CALC: 115 mg/dL — AB (ref 0–99)
NonHDL: 127.3
Triglycerides: 62 mg/dL (ref 0.0–149.0)
VLDL: 12.4 mg/dL (ref 0.0–40.0)

## 2014-11-03 LAB — CBC WITH DIFFERENTIAL/PLATELET
Basophils Absolute: 0 10*3/uL (ref 0.0–0.1)
Basophils Relative: 0.6 % (ref 0.0–3.0)
EOS PCT: 6.9 % — AB (ref 0.0–5.0)
Eosinophils Absolute: 0.5 10*3/uL (ref 0.0–0.7)
HEMATOCRIT: 38.8 % (ref 36.0–46.0)
Hemoglobin: 13.5 g/dL (ref 12.0–15.0)
Lymphocytes Relative: 29 % (ref 12.0–46.0)
Lymphs Abs: 2.1 10*3/uL (ref 0.7–4.0)
MCHC: 34.8 g/dL (ref 30.0–36.0)
MCV: 89.3 fl (ref 78.0–100.0)
MONOS PCT: 9.8 % (ref 3.0–12.0)
Monocytes Absolute: 0.7 10*3/uL (ref 0.1–1.0)
NEUTROS ABS: 3.9 10*3/uL (ref 1.4–7.7)
Neutrophils Relative %: 53.7 % (ref 43.0–77.0)
Platelets: 352 10*3/uL (ref 150.0–400.0)
RBC: 4.35 Mil/uL (ref 3.87–5.11)
RDW: 12.2 % (ref 11.5–15.5)
WBC: 7.3 10*3/uL (ref 4.0–10.5)

## 2014-11-03 LAB — COMPREHENSIVE METABOLIC PANEL
ALT: 13 U/L (ref 0–35)
AST: 21 U/L (ref 0–37)
Albumin: 4.5 g/dL (ref 3.5–5.2)
Alkaline Phosphatase: 59 U/L (ref 39–117)
BUN: 18 mg/dL (ref 6–23)
CHLORIDE: 99 meq/L (ref 96–112)
CO2: 32 meq/L (ref 19–32)
Calcium: 10.2 mg/dL (ref 8.4–10.5)
Creatinine, Ser: 1.02 mg/dL (ref 0.40–1.20)
GFR: 56.43 mL/min — ABNORMAL LOW (ref >60.00)
Glucose, Bld: 102 mg/dL — ABNORMAL HIGH (ref 70–99)
Potassium: 4.4 mEq/L (ref 3.5–5.1)
SODIUM: 136 meq/L (ref 135–145)
Total Bilirubin: 0.6 mg/dL (ref 0.2–1.2)
Total Protein: 7.2 g/dL (ref 6.0–8.3)

## 2014-11-03 LAB — VITAMIN D 25 HYDROXY (VIT D DEFICIENCY, FRACTURES): VITD: 29.1 ng/mL — ABNORMAL LOW (ref 30.00–100.00)

## 2014-11-03 LAB — TSH: TSH: 3.23 u[IU]/mL (ref 0.35–4.50)

## 2014-11-10 ENCOUNTER — Encounter: Payer: Self-pay | Admitting: Family Medicine

## 2014-11-10 ENCOUNTER — Ambulatory Visit (INDEPENDENT_AMBULATORY_CARE_PROVIDER_SITE_OTHER): Payer: Medicare Other | Admitting: Family Medicine

## 2014-11-10 VITALS — BP 118/62 | HR 62 | Temp 97.9°F | Ht 60.0 in | Wt 115.2 lb

## 2014-11-10 DIAGNOSIS — Z23 Encounter for immunization: Secondary | ICD-10-CM

## 2014-11-10 DIAGNOSIS — Z0000 Encounter for general adult medical examination without abnormal findings: Secondary | ICD-10-CM

## 2014-11-10 DIAGNOSIS — M8580 Other specified disorders of bone density and structure, unspecified site: Secondary | ICD-10-CM

## 2014-11-10 DIAGNOSIS — E785 Hyperlipidemia, unspecified: Secondary | ICD-10-CM

## 2014-11-10 MED ORDER — ALENDRONATE SODIUM 70 MG PO TABS: 70.0000 mg | ORAL_TABLET | ORAL | Status: DC

## 2014-11-10 NOTE — Assessment & Plan Note (Signed)
Reviewed health habits including diet and exercise and skin cancer prevention Reviewed appropriate screening tests for age  Also reviewed health mt list, fam hx and immunization status , as well as social and family history   Pt will schedule her own mammogram  prevnar vaccine today  Enc to stay active-doing well

## 2014-11-10 NOTE — Progress Notes (Signed)
Pre visit review using our clinic review tool, if applicable. No additional management support is needed unless otherwise documented below in the visit note. 

## 2014-11-10 NOTE — Assessment & Plan Note (Signed)
3 years of fosamax- tol well and will continue  dexa 2/15 - will be due in a year  Disc need for calcium/ vitamin D/ wt bearing exercise and bone density test every 2 y to monitor Disc safety/ fracture risk in detail   Will add another 1000 iu D daily for level of 29

## 2014-11-10 NOTE — Assessment & Plan Note (Signed)
Disc goals for lipids and reasons to control them Rev labs with pt- stable with good HDL Rev low sat fat diet in detail

## 2014-11-10 NOTE — Patient Instructions (Signed)
Don't forget to schedule your mammogram  prevnar vaccine today Continue calcium and D and add another 1000 iu of vitamin D3 over the counter daily  Take care and stay active

## 2014-11-10 NOTE — Progress Notes (Signed)
Subjective:    Patient ID: Christina Henry, female    DOB: Sep 07, 1941, 74 y.o.   MRN: [id number]  HPI Here for annual medicare wellness exam and also chronic/acute medical problems   I have personally reviewed the Medicare Annual Wellness questionnaire and have noted 1. The patient's medical and social history 2. Their use of alcohol, tobacco or illicit drugs 3. Their current medications and supplements 4. The patient's functional ability including ADL's, fall risks, home safety risks and hearing or visual             impairment. 5. Diet and physical activities 6. Evidence for depression or mood disorders  The patients weight, height, BMI have been recorded in the chart and visual acuity is per eye clinic.  I have made referrals, counseling and provided education to the patient based review of the above and I have provided the pt with a written personalized care plan for preventive services.  Has been doing well  Nothing new    See scanned forms.  Routine anticipatory guidance given to patient.  See health maintenance. Colon cancer screening 7/12- 5 year recall /polyps  Breast cancer screening 2/15 -nl mammogram- she goes to [facility] breast exam no lumps or changes  Flu vaccine 11/15 Tetanus vaccine 1/14 Pneumovax 1/13 , wants to get a prevnar  Zoster vaccine 2/15   Advance directive has a living will and power of attorney already written up  Cognitive function addressed- see scanned forms- and if abnormal then additional documentation follows. - no concerns   PMH and SH reviewed  Meds, vitals, and allergies reviewed.   ROS: See HPI.  Otherwise negative.     Osteopenia -on fosamax  dexa 2/15 D level 29 -she takes ca and D  Needs more D   Hx of partial nephrectomy GFR is stable  Last CT was normal - she does not have to go back for a year   Glucose is 102  Lipids Lab Results  Component Value Date   CHOL 212* 11/03/2014   CHOL 203* 11/03/2013   CHOL 213*  11/05/2012   Lab Results  Component Value Date   HDL 84.70 11/03/2014   HDL 70.50 11/03/2013   HDL 91.00 11/05/2012   Lab Results  Component Value Date   LDLCALC 115* 11/03/2014   Lab Results  Component Value Date   TRIG 62.0 11/03/2014   TRIG 45.0 11/03/2013   TRIG 43.0 11/05/2012   Lab Results  Component Value Date   CHOLHDL 3 11/03/2014   CHOLHDL 3 11/03/2013   CHOLHDL 2 11/05/2012   Lab Results  Component Value Date   LDLDIRECT 122.4 11/03/2013   LDLDIRECT 92.7 11/05/2012   LDLDIRECT 117.8 10/31/2011    Overall stable and well controlled  Eats a healthy diet - avoids the sat fats adn processed foods   Wt is up 5 lb and bmi of 22  For exercise - walking (wears a fit bit) and very very active     Chemistry      Component Value Date/Time   NA 136 11/03/2014 0838   K 4.4 11/03/2014 0838   CL 99 11/03/2014 0838   CO2 32 11/03/2014 0838   BUN 18 11/03/2014 0838   CREATININE 1.02 11/03/2014 0838      Component Value Date/Time   CALCIUM 10.2 11/03/2014 0838   ALKPHOS 59 11/03/2014 0838   AST 21 11/03/2014 0838   ALT 13 11/03/2014 0838   BILITOT 0.6 11/03/2014 3762  Lab Results  Component Value Date   WBC 7.3 11/03/2014   HGB 13.5 11/03/2014   HCT 38.8 11/03/2014   MCV 89.3 11/03/2014   PLT 352.0 11/03/2014   Lab Results  Component Value Date   TSH 3.23 11/03/2014     She has trouble with sciatica - best when active  Review of Systems Review of Systems  Constitutional: Negative for fever, appetite change, fatigue and unexpected weight change.  Eyes: Negative for pain and visual disturbance.  Respiratory: Negative for cough and shortness of breath.   Cardiovascular: Negative for cp or palpitations    Gastrointestinal: Negative for nausea, diarrhea and constipation.  Genitourinary: Negative for urgency and frequency.  Skin: Negative for pallor or rash   MSK pos for chronic low back pain  Neurological: Negative for weakness, light-headedness,  numbness and headaches.  Hematological: Negative for adenopathy. Does not bruise/bleed easily.  Psychiatric/Behavioral: Negative for dysphoric mood. The patient is not nervous/anxious.         Objective:   Physical Exam  Constitutional: She appears well-developed and well-nourished. No distress.  HENT:  Head: Normocephalic and atraumatic.  Right Ear: External ear normal.  Left Ear: External ear normal.  Mouth/Throat: Oropharynx is clear and moist.  Eyes: Conjunctivae and EOM are normal. Pupils are equal, round, and reactive to light. No scleral icterus.  Neck: Normal range of motion. Neck supple. No JVD present. Carotid bruit is not present. No thyromegaly present.  Cardiovascular: Normal rate, regular rhythm, normal heart sounds and intact distal pulses.  Exam reveals no gallop.   Pulmonary/Chest: Effort normal and breath sounds normal. No respiratory distress. She has no wheezes. She exhibits no tenderness.  Abdominal: Soft. Bowel sounds are normal. She exhibits no distension, no abdominal bruit and no mass. There is no tenderness.  Genitourinary: No breast swelling, tenderness, discharge or bleeding.  Breast exam: No mass, nodules, thickening, tenderness, bulging, retraction, inflamation, nipple discharge or skin changes noted.  No axillary or clavicular LA.      Musculoskeletal: Normal range of motion. She exhibits no edema or tenderness.  No kyphosis   Lymphadenopathy:    She has no cervical adenopathy.  Neurological: She is alert. She has normal reflexes. No cranial nerve deficit. She exhibits normal muscle tone. Coordination normal.  Skin: Skin is warm and dry. No rash noted. No erythema. No pallor.  Some lentigos and SKs  Psychiatric: She has a normal mood and affect.          Assessment & Plan:   Problem List Items Addressed This Visit      Musculoskeletal and Integument   Osteopenia    3 years of fosamax- tol well and will continue  dexa 2/15 - will be due in a  year  Disc need for calcium/ vitamin D/ wt bearing exercise and bone density test every 2 y to monitor Disc safety/ fracture risk in detail   Will add another 1000 iu D daily for level of 29        Other   Encounter for Medicare annual wellness exam - Primary    Reviewed health habits including diet and exercise and skin cancer prevention Reviewed appropriate screening tests for age  Also reviewed health mt list, fam hx and immunization status , as well as social and family history   Pt will schedule her own mammogram  prevnar vaccine today  Enc to stay active-doing well        Hyperlipidemia    Disc goals for lipids  and reasons to control them Rev labs with pt- stable with good HDL Rev low sat fat diet in detail

## 2014-11-24 ENCOUNTER — Encounter: Payer: Self-pay | Admitting: Family Medicine

## 2015-01-04 DIAGNOSIS — Z85828 Personal history of other malignant neoplasm of skin: Secondary | ICD-10-CM | POA: Insufficient documentation

## 2015-01-08 ENCOUNTER — Other Ambulatory Visit: Payer: Self-pay | Admitting: Family Medicine

## 2015-11-10 ENCOUNTER — Telehealth: Payer: Self-pay | Admitting: Family Medicine

## 2015-11-10 DIAGNOSIS — E559 Vitamin D deficiency, unspecified: Secondary | ICD-10-CM | POA: Insufficient documentation

## 2015-11-10 DIAGNOSIS — Z0000 Encounter for general adult medical examination without abnormal findings: Secondary | ICD-10-CM

## 2015-11-10 DIAGNOSIS — E785 Hyperlipidemia, unspecified: Secondary | ICD-10-CM

## 2015-11-10 NOTE — Telephone Encounter (Signed)
-----   Message from Marchia Bond sent at 11/07/2015 10:18 AM EST ----- Regarding: Cpx labs Fri 1/27, need orders. Thanks! :-) Please order  future cpx labs for pt's upcoming lab appt. Thanks Aniceto Boss

## 2015-11-11 ENCOUNTER — Other Ambulatory Visit (INDEPENDENT_AMBULATORY_CARE_PROVIDER_SITE_OTHER): Payer: Medicare Other

## 2015-11-11 DIAGNOSIS — E559 Vitamin D deficiency, unspecified: Secondary | ICD-10-CM

## 2015-11-11 DIAGNOSIS — E785 Hyperlipidemia, unspecified: Secondary | ICD-10-CM | POA: Diagnosis not present

## 2015-11-11 DIAGNOSIS — Z0000 Encounter for general adult medical examination without abnormal findings: Secondary | ICD-10-CM | POA: Diagnosis not present

## 2015-11-11 LAB — COMPREHENSIVE METABOLIC PANEL
ALK PHOS: 45 U/L (ref 39–117)
ALT: 13 U/L (ref 0–35)
AST: 18 U/L (ref 0–37)
Albumin: 4.6 g/dL (ref 3.5–5.2)
BUN: 19 mg/dL (ref 6–23)
CO2: 31 mEq/L (ref 19–32)
Calcium: 9.6 mg/dL (ref 8.4–10.5)
Chloride: 96 mEq/L (ref 96–112)
Creatinine, Ser: 0.93 mg/dL (ref 0.40–1.20)
GFR: 62.61 mL/min (ref >60.00)
GLUCOSE: 91 mg/dL (ref 70–99)
POTASSIUM: 3.9 meq/L (ref 3.5–5.1)
SODIUM: 134 meq/L — AB (ref 135–145)
TOTAL PROTEIN: 6.9 g/dL (ref 6.0–8.3)
Total Bilirubin: 0.7 mg/dL (ref 0.2–1.2)

## 2015-11-11 LAB — CBC WITH DIFFERENTIAL/PLATELET
BASOS ABS: 0 10*3/uL (ref 0.0–0.1)
Basophils Relative: 0.5 % (ref 0.0–3.0)
EOS ABS: 0.2 10*3/uL (ref 0.0–0.7)
Eosinophils Relative: 2.3 % (ref 0.0–5.0)
HCT: 40.2 % (ref 36.0–46.0)
HEMOGLOBIN: 13.5 g/dL (ref 12.0–15.0)
Lymphocytes Relative: 31.7 % (ref 12.0–46.0)
Lymphs Abs: 2.4 10*3/uL (ref 0.7–4.0)
MCHC: 33.6 g/dL (ref 30.0–36.0)
MCV: 91.3 fl (ref 78.0–100.0)
MONO ABS: 0.7 10*3/uL (ref 0.1–1.0)
Monocytes Relative: 9.7 % (ref 3.0–12.0)
Neutro Abs: 4.2 10*3/uL (ref 1.4–7.7)
Neutrophils Relative %: 55.8 % (ref 43.0–77.0)
Platelets: 335 10*3/uL (ref 150.0–400.0)
RBC: 4.41 Mil/uL (ref 3.87–5.11)
RDW: 12.6 % (ref 11.5–15.5)
WBC: 7.6 10*3/uL (ref 4.0–10.5)

## 2015-11-11 LAB — LIPID PANEL
Cholesterol: 204 mg/dL — ABNORMAL HIGH (ref 0–200)
HDL: 84.2 mg/dL (ref >39.00)
LDL CALC: 108 mg/dL — AB (ref 0–99)
NONHDL: 120.09
Total CHOL/HDL Ratio: 2
Triglycerides: 58 mg/dL (ref 0.0–149.0)
VLDL: 11.6 mg/dL (ref 0.0–40.0)

## 2015-11-11 LAB — VITAMIN D 25 HYDROXY (VIT D DEFICIENCY, FRACTURES): VITD: 47.7 ng/mL (ref 30.00–100.00)

## 2015-11-11 LAB — TSH: TSH: 1.82 u[IU]/mL (ref 0.35–4.50)

## 2015-11-15 ENCOUNTER — Encounter: Payer: Self-pay | Admitting: Family Medicine

## 2015-11-15 ENCOUNTER — Ambulatory Visit (INDEPENDENT_AMBULATORY_CARE_PROVIDER_SITE_OTHER): Payer: Medicare Other | Admitting: Family Medicine

## 2015-11-15 VITALS — BP 128/64 | HR 65 | Temp 98.3°F | Ht 59.0 in | Wt 113.8 lb

## 2015-11-15 DIAGNOSIS — M8580 Other specified disorders of bone density and structure, unspecified site: Secondary | ICD-10-CM

## 2015-11-15 DIAGNOSIS — Z0000 Encounter for general adult medical examination without abnormal findings: Secondary | ICD-10-CM

## 2015-11-15 DIAGNOSIS — E785 Hyperlipidemia, unspecified: Secondary | ICD-10-CM | POA: Diagnosis not present

## 2015-11-15 DIAGNOSIS — Z23 Encounter for immunization: Secondary | ICD-10-CM

## 2015-11-15 DIAGNOSIS — R935 Abnormal findings on diagnostic imaging of other abdominal regions, including retroperitoneum: Secondary | ICD-10-CM | POA: Insufficient documentation

## 2015-11-15 DIAGNOSIS — E559 Vitamin D deficiency, unspecified: Secondary | ICD-10-CM

## 2015-11-15 DIAGNOSIS — E2839 Other primary ovarian failure: Secondary | ICD-10-CM

## 2015-11-15 MED ORDER — ALENDRONATE SODIUM 70 MG PO TABS: ORAL_TABLET | ORAL | Status: DC

## 2015-11-15 NOTE — Assessment & Plan Note (Signed)
Vitamin D level is therapeutic with current supplementation Disc importance of this to bone and overall health  

## 2015-11-15 NOTE — Assessment & Plan Note (Signed)
Plan 2 y f/u dexa Post menopausal-on fosamax (done with 5 y course in oct) No falls or fx  On ca and D  Disc need for calcium/ vitamin D/ wt bearing exercise and bone density test every 2 y to monitor Disc safety/ fracture risk in detail

## 2015-11-15 NOTE — Progress Notes (Signed)
Subjective:    Patient ID: Christina Henry, female    DOB: 1941-03-02, 75 y.o.   MRN: [id number]  HPI Here for annual medicare wellness visit as well as chronic/acute medical problems as well as annual preventative exam  Wt is down 2 lb with bmi of 22.9   I have personally reviewed the Medicare Annual Wellness questionnaire and have noted 1. The patient's medical and social history 2. Their use of alcohol, tobacco or illicit drugs 3. Their current medications and supplements 4. The patient's functional ability including ADL's, fall risks, home safety risks and hearing or visual             impairment. 5. Diet and physical activities 6. Evidence for depression or mood disorders  The patients weight, height, BMI have been recorded in the chart and visual acuity is per eye clinic.  I have made referrals, counseling and provided education to the patient based review of the above and I have provided the pt with a written personalized care plan for preventive services. Reviewed and updated provider list, see scanned forms.  Doing ok overall   See scanned forms.  Routine anticipatory guidance given to patient.  See health maintenance. Colon cancer screening 7/12 - will be due for 5 year recall in July (Dr Vira Agar)  Breast cancer screening 2/16 - due next month -has the notice to schedule it -[facility]  Self breast exam- no lumps  Flu vaccine- due today  Tetanus vaccine 1/14 Pneumovax 1/16- had both /is complete  Zoster vaccine 2/15  dexa 2/15- it has been 2 years - taking fosamax /doing ok - no problems , 5 year course is done in Oct 2017 No falls or fractures  Vit D level is good at 47.7 Takes her ca and vit D No fractures in her lifetime  Advance directive - has a living will and power of attorney   (she plans to review it)  Cognitive function addressed- see scanned forms- and if abnormal then additional documentation follows. No issues - but she notices some change in name recall /  does not get lost or confused    PMH and SH reviewed  Meds, vitals, and allergies reviewed.   ROS: See HPI.  Otherwise negative.      D level -nl at 47.7  Hyperlipidemia Lab Results  Component Value Date   CHOL 204* 11/11/2015   CHOL 212* 11/03/2014   CHOL 203* 11/03/2013   Lab Results  Component Value Date   HDL 84.20 11/11/2015   HDL 84.70 11/03/2014   HDL 70.50 11/03/2013   Lab Results  Component Value Date   LDLCALC 108* 11/11/2015   LDLCALC 115* 11/03/2014   Lab Results  Component Value Date   TRIG 58.0 11/11/2015   TRIG 62.0 11/03/2014   TRIG 45.0 11/03/2013   Lab Results  Component Value Date   CHOLHDL 2 11/11/2015   CHOLHDL 3 11/03/2014   CHOLHDL 3 11/03/2013   Lab Results  Component Value Date   LDLDIRECT 122.4 11/03/2013   LDLDIRECT 92.7 11/05/2012   LDLDIRECT 117.8 10/31/2011   diet controlled  Very good profile -eats very well / very little fat and junk food  She gets a fair amt of exercise  Plays golf and walks every day   Had a CT for f/u of kidney cancer -no evidence of recurrent dz  Has f/u Feb 20 with oncol  Had some change in endometrium-? Endometrial hyperplasia  Will disc getting an ultrasound when she  has her appointment  She no longer sees gyn - and last pap 2012  Wants to discuss with her gyn at the appointment   Patient Active Problem List   Diagnosis Date Noted  . Vitamin D deficiency 11/10/2015  . Sciatica associated with disorder of lumbar spine 11/10/2013  . Encounter for Medicare annual wellness exam 11/02/2013  . Hyperlipidemia 11/04/2012  . Routine general medical examination at a health care facility 10/31/2011  . Osteopenia 07/31/2011   Past Medical History  Diagnosis Date  . Osteopenia   . Renal cell carcinoma ([facility]) 12/14    clear cell/ partial nephrectomy   Past Surgical History  Procedure Laterality Date  . Robotic assited partial nephrectomy  12/14    renal clear cell carcinoma [facility]   Social History    Substance Use Topics  . Smoking status: Never Smoker   . Smokeless tobacco: None  . Alcohol Use: 0.0 oz/week    0 Standard drinks or equivalent per week     Comment: wine daily   No family history on file. Allergies  Allergen Reactions  . Demerol Nausea Only  . Meperidine Nausea Only   Current Outpatient Prescriptions on File Prior to Visit  Medication Sig Dispense Refill  . alendronate (FOSAMAX) 70 MG tablet TAKE 1 TABLET BY MOUTH EVERY 7 DAYS (TAKE WITH FULL GLASS OF WATER ON AN EMPTY STOMACH 12 tablet 3  . Biotin 1000 MCG tablet Take 1,000 mcg by mouth 2 (two) times daily.    . Calcium Carbonate-Vit D-Min 600-400 MG-UNIT TABS Take 1 tablet by mouth 2 (two) times daily.     Marland Kitchen gabapentin (NEURONTIN) 100 MG capsule Take 1 capsule (100 mg total) by mouth 3 (three) times daily. 90 capsule 11   No current facility-administered medications on file prior to visit.     Review of Systems Review of Systems  Constitutional: Negative for fever, appetite change, fatigue and unexpected weight change.  Eyes: Negative for pain and visual disturbance.  Respiratory: Negative for cough and shortness of breath.   Cardiovascular: Negative for cp or palpitations    Gastrointestinal: Negative for nausea, diarrhea and constipation.  Genitourinary: Negative for urgency and frequency.  Skin: Negative for pallor or rash   MSK pos for chronic back pain/ sciatica  Neurological: Negative for weakness, light-headedness, numbness and headaches.  Hematological: Negative for adenopathy. Does not bruise/bleed easily.  Psychiatric/Behavioral: Negative for dysphoric mood. The patient is not nervous/anxious.         Objective:   Physical Exam  Constitutional: She appears well-developed and well-nourished. No distress.  Well appearing   HENT:  Head: Normocephalic and atraumatic.  Right Ear: External ear normal.  Left Ear: External ear normal.  Mouth/Throat: Oropharynx is clear and moist.  Eyes:  Conjunctivae and EOM are normal. Pupils are equal, round, and reactive to light. No scleral icterus.  Neck: Normal range of motion. Neck supple. No JVD present. Carotid bruit is not present. No thyromegaly present.  Cardiovascular: Normal rate, regular rhythm, normal heart sounds and intact distal pulses.  Exam reveals no gallop.   Pulmonary/Chest: Effort normal and breath sounds normal. No respiratory distress. She has no wheezes. She exhibits no tenderness.  Abdominal: Soft. Bowel sounds are normal. She exhibits no distension, no abdominal bruit and no mass. There is no tenderness.  Genitourinary: No breast swelling, tenderness, discharge or bleeding.  Breast exam: No mass, nodules, thickening, tenderness, bulging, retraction, inflamation, nipple discharge or skin changes noted.  No axillary or clavicular LA.  Musculoskeletal: Normal range of motion. She exhibits no edema or tenderness.  No kyphosis   Lymphadenopathy:    She has no cervical adenopathy.  Neurological: She is alert. She has normal reflexes. No cranial nerve deficit. She exhibits normal muscle tone. Coordination normal.  Skin: Skin is warm and dry. No rash noted. No erythema. No pallor.  Lentigo diffusely  Psychiatric: She has a normal mood and affect.          Assessment & Plan:   Problem List Items Addressed This Visit      Musculoskeletal and Integument   Osteopenia    Plan 2 y f/u dexa Post menopausal-on fosamax (done with 5 y course in oct) No falls or fx  On ca and D  Disc need for calcium/ vitamin D/ wt bearing exercise and bone density test every 2 y to monitor Disc safety/ fracture risk in detail          Other   Encounter for Medicare annual wellness exam - Primary    Reviewed health habits including diet and exercise and skin cancer prevention Reviewed appropriate screening tests for age  Also reviewed health mt list, fam hx and immunization status , as well as social and family history   See  HPI Labs reviewed Flu shot today  Follow up with oncology, I am happy to order your endometrial ultrasound in the meantime if you change your mind  Your colonoscopy is due 7/17 with Dr Vira Agar  Stop fosamax in oct of 2017 -you will be done  Stop at check out for dexa referral  Don't forget to schedule your mammogram      Estrogen deficiency   Relevant Orders   DG Bone Density   Hyperlipidemia    Disc goals for lipids and reasons to control them Rev labs with pt Rev low sat fat diet in detail Improved overall        Routine general medical examination at a health care facility    Reviewed health habits including diet and exercise and skin cancer prevention Reviewed appropriate screening tests for age  Also reviewed health mt list, fam hx and immunization status , as well as social and family history   See HPI Labs reviewed Flu shot today  Follow up with oncology, I am happy to order your endometrial ultrasound in the meantime if you change your mind  Your colonoscopy is due 7/17 with Dr Vira Agar  Stop fosamax in oct of 2017 -you will be done  Don't forget to schedule your mammogam  Stop at check out for bone density test referral       Vitamin D deficiency    Vitamin D level is therapeutic with current supplementation Disc importance of this to bone and overall health        Other Visit Diagnoses    Need for influenza vaccination        Relevant Orders    Flu Vaccine QUAD 36+ mos PF IM (Fluarix & Fluzone Quad PF) (Completed)

## 2015-11-15 NOTE — Progress Notes (Signed)
Pre visit review using our clinic review tool, if applicable. No additional management support is needed unless otherwise documented below in the visit note. 

## 2015-11-15 NOTE — Assessment & Plan Note (Signed)
Reviewed health habits including diet and exercise and skin cancer prevention Reviewed appropriate screening tests for age  Also reviewed health mt list, fam hx and immunization status , as well as social and family history   See HPI Labs reviewed Flu shot today  Follow up with oncology, I am happy to order your endometrial ultrasound in the meantime if you change your mind  Your colonoscopy is due 7/17 with Dr Vira Agar  Stop fosamax in oct of 2017 -you will be done  Stop at check out for dexa referral  Don't forget to schedule your mammogram

## 2015-11-15 NOTE — Patient Instructions (Addendum)
Flu shot today  Follow up with oncology, I am happy to order your endometrial ultrasound in the meantime if you change your mind  Your colonoscopy is due 7/17 with Dr Vira Agar  Stop fosamax in oct of 2017 -you will be done  Don't forget to schedule your mammogam  Stop at check out for bone density test referral

## 2015-11-15 NOTE — Assessment & Plan Note (Signed)
Disc goals for lipids and reasons to control them Rev labs with pt Rev low sat fat diet in detail Improved overall

## 2015-11-15 NOTE — Assessment & Plan Note (Signed)
Reviewed health habits including diet and exercise and skin cancer prevention Reviewed appropriate screening tests for age  Also reviewed health mt list, fam hx and immunization status , as well as social and family history   See HPI Labs reviewed Flu shot today  Follow up with oncology, I am happy to order your endometrial ultrasound in the meantime if you change your mind  Your colonoscopy is due 7/17 with Dr Vira Agar  Stop fosamax in oct of 2017 -you will be done  Don't forget to schedule your mammogam  Stop at check out for bone density test referral

## 2015-11-22 ENCOUNTER — Encounter: Payer: Self-pay | Admitting: Family Medicine

## 2015-11-24 ENCOUNTER — Ambulatory Visit (INDEPENDENT_AMBULATORY_CARE_PROVIDER_SITE_OTHER): Payer: Medicare Other | Admitting: Primary Care

## 2015-11-24 ENCOUNTER — Encounter: Payer: Self-pay | Admitting: Primary Care

## 2015-11-24 VITALS — BP 124/74 | HR 66 | Temp 98.0°F | Ht 59.5 in | Wt 116.0 lb

## 2015-11-24 DIAGNOSIS — R059 Cough, unspecified: Secondary | ICD-10-CM

## 2015-11-24 DIAGNOSIS — R05 Cough: Secondary | ICD-10-CM | POA: Diagnosis not present

## 2015-11-24 MED ORDER — AZITHROMYCIN 250 MG PO TABS: ORAL_TABLET | ORAL | Status: DC

## 2015-11-24 NOTE — Progress Notes (Signed)
Pre visit review using our clinic review tool, if applicable. No additional management support is needed unless otherwise documented below in the visit note. 

## 2015-11-24 NOTE — Patient Instructions (Signed)
Start Azithromycin antibiotics on Monday next week if no improvement in your symptoms. Take 2 tablets by mouth on day 1, then 1 tablet daily for 4 additional days.  Cough/Congestion: Try taking Mucinex DM. This will help loosen up the mucous in your chest. Ensure you take this medication with a full glass of water.  You may also take Robitussin cough syrup if no improvement with Mucinex.  Increase consumption of water and rest.  It was a pleasure to see you today!

## 2015-11-24 NOTE — Progress Notes (Signed)
Subjective:    Patient ID: Christina Henry, female    DOB: 01/07/1941, 75 y.o.   MRN: [id number]  HPI  Christina Henry is a 75 year old female who presents today with a chief complaint of cough. She also reports sore throat, fatigue, and chest congestion. Her husband was diagnosed with acute bronchitis yesterday. Her symptoms have been present for the past 3 days. Her cough is mostly dry, sometimes with yellow sputum. She's not taken any medication over the counter for her symptoms. Denies fevers, chills, sinus pressure.  Review of Systems  Constitutional: Positive for fatigue. Negative for fever and chills.  HENT: Positive for congestion and sore throat. Negative for postnasal drip and sinus pressure.   Respiratory: Positive for cough. Negative for shortness of breath.   Cardiovascular: Negative for chest pain.       Past Medical History  Diagnosis Date  . Osteopenia   . Renal cell carcinoma ([place]) 12/14    clear cell/ partial nephrectomy    Social History   Social History  . Marital Status: Single    Spouse Name: N/A  . Number of Children: N/A  . Years of Education: N/A   Occupational History  . Not on file.   Social History Main Topics  . Smoking status: Never Smoker   . Smokeless tobacco: Not on file  . Alcohol Use: 0.0 oz/week    0 Standard drinks or equivalent per week     Comment: wine daily  . Drug Use: No  . Sexual Activity: Not on file   Other Topics Concern  . Not on file   Social History Narrative    Past Surgical History  Procedure Laterality Date  . Robotic assited partial nephrectomy  12/14    renal clear cell carcinoma [facility]    No family history on file.  Allergies  Allergen Reactions  . Demerol Nausea Only  . Meperidine Nausea Only    Current Outpatient Prescriptions on File Prior to Visit  Medication Sig Dispense Refill  . alendronate (FOSAMAX) 70 MG tablet TAKE 1 TABLET BY MOUTH EVERY 7 DAYS (TAKE WITH FULL GLASS OF WATER ON AN EMPTY  STOMACH 12 tablet 3  . Biotin 1000 MCG tablet Take 1,000 mcg by mouth 2 (two) times daily.    . Calcium Carbonate-Vit D-Min 600-400 MG-UNIT TABS Take 1 tablet by mouth 2 (two) times daily.     Marland Kitchen gabapentin (NEURONTIN) 100 MG capsule Take 1 capsule (100 mg total) by mouth 3 (three) times daily. 90 capsule 11   No current facility-administered medications on file prior to visit.    BP 124/74 mmHg  Pulse 66  Temp(Src) 98 F (36.7 C) (Oral)  Ht 4' 11.5" (1.511 m)  Wt 116 lb (52.617 kg)  BMI 23.05 kg/m2  SpO2 98%    Objective:   Physical Exam  Constitutional: She appears well-nourished.  HENT:  Right Ear: Tympanic membrane and ear canal normal.  Left Ear: Tympanic membrane and ear canal normal.  Nose: Nose normal.  Mouth/Throat: Oropharynx is clear and moist.  Eyes: Conjunctivae are normal.  Neck: Neck supple.  Cardiovascular: Normal rate and regular rhythm.   Pulmonary/Chest: Effort normal and breath sounds normal. She has no wheezes. She has no rales.  Skin: Skin is warm and dry.          Assessment & Plan:  URI:  Cough, chest congestion, fatigue x 3 days. Husband recently diagnosed and treated for acute bronchitis. Exam unremarkable. Lungs clear. Does  not appear ill. She is worried she may catch the same infection as she's already developed symptoms. Suspect viral URI at this point and will treat with supportive measures. Flonase, mucinex/robitussin, fluids, rest. Will print Zpak RX to use if symptoms progress. Discussed for her to fill symptoms become worse in 4 days. She verbalized understanding.

## 2015-11-28 ENCOUNTER — Encounter: Payer: Self-pay | Admitting: Family Medicine

## 2015-11-30 ENCOUNTER — Encounter: Payer: Self-pay | Admitting: Family Medicine

## 2015-12-06 ENCOUNTER — Telehealth: Payer: Self-pay | Admitting: Family Medicine

## 2015-12-06 DIAGNOSIS — N8500 Endometrial hyperplasia, unspecified: Secondary | ICD-10-CM

## 2015-12-06 NOTE — Telephone Encounter (Signed)
Patient said she spoke to Dr.Tower about a follow up Ultrasound. Patient wants to have it done at [place].  Patient said she was called earlier by Dr.Tower's assistant.  Please call patient.

## 2015-12-07 ENCOUNTER — Encounter: Payer: Self-pay | Admitting: Family Medicine

## 2015-12-07 DIAGNOSIS — Z8742 Personal history of other diseases of the female genital tract: Secondary | ICD-10-CM | POA: Insufficient documentation

## 2015-12-07 DIAGNOSIS — N8500 Endometrial hyperplasia, unspecified: Secondary | ICD-10-CM | POA: Insufficient documentation

## 2015-12-07 NOTE — Telephone Encounter (Signed)
This is the order for the [place] Thanks

## 2015-12-14 ENCOUNTER — Other Ambulatory Visit: Payer: Self-pay | Admitting: Family Medicine

## 2015-12-16 ENCOUNTER — Encounter: Payer: Self-pay | Admitting: Family Medicine

## 2015-12-16 ENCOUNTER — Telehealth: Payer: Self-pay | Admitting: Family Medicine

## 2015-12-16 DIAGNOSIS — N8500 Endometrial hyperplasia, unspecified: Secondary | ICD-10-CM

## 2015-12-16 NOTE — Telephone Encounter (Signed)
Please let pt know that her ultrasound did show endometrial hyperplasia (that means a thickened / abnormal appearing lining to her uterus)  I need to refer her to gyn for further eval of this  I will put in referral (urgent so I can get her in quicker) and forward to [place] and [place]

## 2015-12-19 NOTE — Telephone Encounter (Signed)
Appt made with Dr Gaetano Net for 12/20/15 and patient is aware.

## 2015-12-19 NOTE — Telephone Encounter (Signed)
Pt notified of [place] results and advised one of our [facility] will call to schedule appt

## 2016-01-25 ENCOUNTER — Encounter (HOSPITAL_COMMUNITY)
Admission: RE | Admit: 2016-01-25 | Discharge: 2016-01-25 | Disposition: A | Discharge status: Home / Self Care | Payer: Medicare Other | Source: Ambulatory Visit | Attending: Obstetrics and Gynecology | Admitting: Obstetrics and Gynecology

## 2016-01-25 ENCOUNTER — Other Ambulatory Visit: Payer: Self-pay

## 2016-01-25 ENCOUNTER — Encounter (HOSPITAL_COMMUNITY): Payer: Self-pay

## 2016-01-25 DIAGNOSIS — Z85528 Personal history of other malignant neoplasm of kidney: Secondary | ICD-10-CM | POA: Diagnosis not present

## 2016-01-25 DIAGNOSIS — Z01812 Encounter for preprocedural laboratory examination: Secondary | ICD-10-CM | POA: Diagnosis not present

## 2016-01-25 DIAGNOSIS — Z01810 Encounter for preprocedural cardiovascular examination: Secondary | ICD-10-CM | POA: Diagnosis present

## 2016-01-25 DIAGNOSIS — N858 Other specified noninflammatory disorders of uterus: Secondary | ICD-10-CM | POA: Insufficient documentation

## 2016-01-25 HISTORY — DX: Unspecified osteoarthritis, unspecified site: M19.90

## 2016-01-25 LAB — COMPREHENSIVE METABOLIC PANEL
ALK PHOS: 49 U/L (ref 38–126)
ALT: 20 U/L (ref 14–54)
ANION GAP: 7 (ref 5–15)
AST: 24 U/L (ref 15–41)
Albumin: 4.7 g/dL (ref 3.5–5.0)
BUN: 20 mg/dL (ref 6–20)
CHLORIDE: 101 mmol/L (ref 101–111)
CO2: 29 mmol/L (ref 22–32)
Calcium: 9.8 mg/dL (ref 8.9–10.3)
Creatinine, Ser: 1.04 mg/dL — ABNORMAL HIGH (ref 0.44–1.00)
GFR calc non Af Amer: 52 mL/min — ABNORMAL LOW (ref >60)
GFR, EST AFRICAN AMERICAN: 60 mL/min — AB (ref >60)
Glucose, Bld: 94 mg/dL (ref 65–99)
Potassium: 4.4 mmol/L (ref 3.5–5.1)
SODIUM: 137 mmol/L (ref 135–145)
Total Bilirubin: 0.5 mg/dL (ref 0.3–1.2)
Total Protein: 7.3 g/dL (ref 6.5–8.1)

## 2016-01-25 LAB — CBC
HCT: 37.5 % (ref 36.0–46.0)
HEMOGLOBIN: 13 g/dL (ref 12.0–15.0)
MCH: 30.4 pg (ref 26.0–34.0)
MCHC: 34.7 g/dL (ref 30.0–36.0)
MCV: 87.8 fL (ref 78.0–100.0)
PLATELETS: 295 10*3/uL (ref 150–400)
RBC: 4.27 MIL/uL (ref 3.87–5.11)
RDW: 13 % (ref 11.5–15.5)
WBC: 6.7 10*3/uL (ref 4.0–10.5)

## 2016-01-25 NOTE — Anesthesia Preprocedure Evaluation (Addendum)
Anesthesia Evaluation  Patient identified by MRN, date of birth, ID band Patient awake    Reviewed: Allergy & Precautions, NPO status , Patient's Chart, lab work & pertinent test results  Airway Mallampati: II   Neck ROM: Full    Dental  (+) Teeth Intact, Dental Advisory Given   Pulmonary neg pulmonary ROS,    breath sounds clear to auscultation       Cardiovascular negative cardio ROS   Rhythm:Regular  EKG 01/2016 WNL   Neuro/Psych negative neurological ROS  negative psych ROS   GI/Hepatic negative GI ROS, Neg liver ROS,   Endo/Other  negative endocrine ROS  Renal/GU negative Renal ROSSP partial nephrectomy for CA     Endometrial hyperplasia negative genitourinary   Musculoskeletal negative musculoskeletal ROS (+)   Abdominal   Peds negative pediatric ROS (+)  Hematology negative hematology ROS (+) 13/37   Anesthesia Other Findings   Reproductive/Obstetrics negative OB ROS                            Anesthesia Physical Anesthesia Plan  ASA: II  Anesthesia Plan: General   Post-op Pain Management:    Induction: Intravenous  Airway Management Planned: LMA  Additional Equipment:   Intra-op Plan:   Post-operative Plan:   Informed Consent: I have reviewed the patients History and Physical, chart, labs and discussed the procedure including the risks, benefits and alternatives for the proposed anesthesia with the patient or authorized representative who has indicated his/her understanding and acceptance.     Plan Discussed with:   Anesthesia Plan Comments:         Anesthesia Quick Evaluation

## 2016-01-25 NOTE — Patient Instructions (Addendum)
Your procedure is scheduled on: February 01, 2016  Enter through the Main Entrance of [facility] at: 7:00 am   Pick up the phone at the desk and dial [phone].  Call this number if you have problems the morning of surgery: [phone].  Remember: Do NOT eat food: after midnight on Tuesday  Do NOT drink clear liquids after: midnight on Tuesday  Take these medicines the morning of surgery with a SIP OF WATER: none   Do NOT wear jewelry (body piercing), metal hair clips/bobby pins, make-up, or nail polish. Do NOT wear lotions, powders, or perfumes.  You may wear deoderant. Do NOT shave for 48 hours prior to surgery. Do NOT bring valuables to the hospital. Contacts, dentures, or bridgework may not be worn into surgery.  Have a responsible adult drive you home and stay with you for 24 hours after your procedure

## 2016-02-01 ENCOUNTER — Ambulatory Visit (HOSPITAL_COMMUNITY): Payer: Medicare Other | Admitting: Anesthesiology

## 2016-02-01 ENCOUNTER — Encounter (HOSPITAL_COMMUNITY)
Admission: RE | Disposition: A | Discharge status: Home / Self Care | Payer: Self-pay | Source: Ambulatory Visit | Attending: Obstetrics and Gynecology

## 2016-02-01 ENCOUNTER — Encounter (HOSPITAL_COMMUNITY): Payer: Self-pay

## 2016-02-01 ENCOUNTER — Ambulatory Visit (HOSPITAL_COMMUNITY)
Admission: RE | Admit: 2016-02-01 | Discharge: 2016-02-01 | Disposition: A | Discharge status: Home / Self Care | Payer: Medicare Other | Source: Ambulatory Visit | Attending: Obstetrics and Gynecology | Admitting: Obstetrics and Gynecology

## 2016-02-01 DIAGNOSIS — M8580 Other specified disorders of bone density and structure, unspecified site: Secondary | ICD-10-CM | POA: Insufficient documentation

## 2016-02-01 DIAGNOSIS — M1990 Unspecified osteoarthritis, unspecified site: Secondary | ICD-10-CM | POA: Diagnosis not present

## 2016-02-01 DIAGNOSIS — R938 Abnormal findings on diagnostic imaging of other specified body structures: Secondary | ICD-10-CM | POA: Diagnosis present

## 2016-02-01 DIAGNOSIS — N840 Polyp of corpus uteri: Secondary | ICD-10-CM | POA: Diagnosis not present

## 2016-02-01 DIAGNOSIS — Z85528 Personal history of other malignant neoplasm of kidney: Secondary | ICD-10-CM | POA: Insufficient documentation

## 2016-02-01 HISTORY — DX: Other specified dorsopathies, lumbar region: M53.86

## 2016-02-01 HISTORY — PX: HYSTEROSCOPY WITH D & C: SHX1775

## 2016-02-01 SURGERY — DILATATION AND CURETTAGE /HYSTEROSCOPY
Anesthesia: General | Site: Vagina

## 2016-02-01 MED ORDER — CEFAZOLIN SODIUM-DEXTROSE 2-3 GM-% IV SOLR
INTRAVENOUS | Status: AC
  Filled 2016-02-01: qty 50

## 2016-02-01 MED ORDER — PROPOFOL 10 MG/ML IV BOLUS
INTRAVENOUS | Status: DC | PRN
  Administered 2016-02-01: 150 mg via INTRAVENOUS

## 2016-02-01 MED ORDER — FENTANYL CITRATE (PF) 100 MCG/2ML IJ SOLN
INTRAMUSCULAR | Status: DC | PRN
  Administered 2016-02-01: 50 ug via INTRAVENOUS

## 2016-02-01 MED ORDER — KETOROLAC TROMETHAMINE 30 MG/ML IJ SOLN
INTRAMUSCULAR | Status: DC | PRN
  Administered 2016-02-01: 30 mg via INTRAVENOUS

## 2016-02-01 MED ORDER — MIDAZOLAM HCL 2 MG/2ML IJ SOLN
INTRAMUSCULAR | Status: AC
  Filled 2016-02-01: qty 2

## 2016-02-01 MED ORDER — LIDOCAINE HCL 1 % IJ SOLN
INTRAMUSCULAR | Status: AC
  Filled 2016-02-01: qty 20

## 2016-02-01 MED ORDER — DEXAMETHASONE SODIUM PHOSPHATE 4 MG/ML IJ SOLN
INTRAMUSCULAR | Status: DC | PRN
  Administered 2016-02-01: 10 mg via INTRAVENOUS

## 2016-02-01 MED ORDER — FENTANYL CITRATE (PF) 100 MCG/2ML IJ SOLN: 25.0000 ug | INTRAMUSCULAR | Status: DC | PRN

## 2016-02-01 MED ORDER — CEFAZOLIN SODIUM-DEXTROSE 2-4 GM/100ML-% IV SOLN
2.0000 g | INTRAVENOUS | Status: AC
  Administered 2016-02-01: 2 g via INTRAVENOUS
  Filled 2016-02-01: qty 100

## 2016-02-01 MED ORDER — LIDOCAINE HCL 1 % IJ SOLN
INTRAMUSCULAR | Status: DC | PRN
  Administered 2016-02-01: 10 mL

## 2016-02-01 MED ORDER — ONDANSETRON HCL 4 MG/2ML IJ SOLN
INTRAMUSCULAR | Status: DC | PRN
  Administered 2016-02-01: 4 mg via INTRAVENOUS

## 2016-02-01 MED ORDER — LIDOCAINE HCL (CARDIAC) 20 MG/ML IV SOLN
INTRAVENOUS | Status: DC | PRN
  Administered 2016-02-01: 100 mg via INTRAVENOUS

## 2016-02-01 MED ORDER — FENTANYL CITRATE (PF) 100 MCG/2ML IJ SOLN
INTRAMUSCULAR | Status: AC
Start: 2016-02-01 — End: 2016-02-01
  Filled 2016-02-01: qty 2

## 2016-02-01 MED ORDER — ONDANSETRON HCL 4 MG/2ML IJ SOLN: 4.0000 mg | Freq: Four times a day (QID) | INTRAMUSCULAR | Status: DC

## 2016-02-01 MED ORDER — MIDAZOLAM HCL 5 MG/5ML IJ SOLN
INTRAMUSCULAR | Status: DC | PRN
  Administered 2016-02-01 (×2): 1 mg via INTRAVENOUS

## 2016-02-01 MED ORDER — LACTATED RINGERS IV SOLN
INTRAVENOUS | Status: DC
  Administered 2016-02-01: 09:00:00 via INTRAVENOUS
  Administered 2016-02-01: 125 mL/h via INTRAVENOUS

## 2016-02-01 SURGICAL SUPPLY — 19 items
CANISTER SUCT 3000ML (MISCELLANEOUS) ×2 IMPLANT
CATH ROBINSON RED A/P 16FR (CATHETERS) ×2 IMPLANT
CLOTH BEACON ORANGE TIMEOUT ST (SAFETY) ×2 IMPLANT
CONTAINER PREFILL 10% NBF 60ML (FORM) ×4 IMPLANT
ELECT REM PT RETURN 9FT ADLT (ELECTROSURGICAL)
ELECTRODE REM PT RTRN 9FT ADLT (ELECTROSURGICAL) IMPLANT
GLOVE BIO SURGEON STRL SZ8 (GLOVE) ×4 IMPLANT
GLOVE BIOGEL PI IND STRL 7.0 (GLOVE) ×1 IMPLANT
GLOVE BIOGEL PI IND STRL 8 (GLOVE) ×1 IMPLANT
GLOVE BIOGEL PI INDICATOR 7.0 (GLOVE) ×1
GLOVE BIOGEL PI INDICATOR 8 (GLOVE) ×1
GOWN STRL REUS W/TWL LRG LVL3 (GOWN DISPOSABLE) ×4 IMPLANT
LOOP ANGLED CUTTING 22FR (CUTTING LOOP) IMPLANT
PACK VAGINAL MINOR WOMEN LF (CUSTOM PROCEDURE TRAY) ×2 IMPLANT
PAD OB MATERNITY 4.3X12.25 (PERSONAL CARE ITEMS) ×2 IMPLANT
TOWEL OR 17X24 6PK STRL BLUE (TOWEL DISPOSABLE) ×4 IMPLANT
TUBING AQUILEX INFLOW (TUBING) ×2 IMPLANT
TUBING AQUILEX OUTFLOW (TUBING) ×2 IMPLANT
WATER STERILE IRR 1000ML POUR (IV SOLUTION) ×2 IMPLANT

## 2016-02-01 NOTE — Brief Op Note (Signed)
02/01/2016  8:49 AM  PATIENT:  Christina Henry  75 y.o. female  PRE-OPERATIVE DIAGNOSIS:  thickened endometrium  POST-OPERATIVE DIAGNOSIS:  thickened endometrium  PROCEDURE:  Procedure(s): DILATATION AND CURETTAGE /HYSTEROSCOPY (N/A)  SURGEON:  Surgeon(s) and Role:    * Everlene Farrier, MD - Primary  PHYSICIAN ASSISTANT:   ASSISTANTS: none   ANESTHESIA:   general  EBL:  Total I/O In: 1000 [I.V.:1000] Out: 30 [Urine:25; Blood:5]  BLOOD ADMINISTERED:none  DRAINS: none   LOCAL MEDICATIONS USED:  LIDOCAINE  and Amount: 10 ml  SPECIMEN:  Source of Specimen:  EM Curretings and EM polyp  DISPOSITION OF SPECIMEN:  PATHOLOGY  COUNTS:  YES  TOURNIQUET:  * No tourniquets in log *  DICTATION: .Other Dictation: Dictation Number [id number]  PLAN OF CARE: Discharge to home after PACU  PATIENT DISPOSITION:  PACU - hemodynamically stable.   Delay start of Pharmacological VTE agent (>24hrs) due to surgical blood loss or risk of bleeding: not applicable

## 2016-02-01 NOTE — Anesthesia Procedure Notes (Signed)
Procedure Name: LMA Insertion Date/Time: 02/01/2016 8:28 AM Performed by: Riki Sheer Pre-anesthesia Checklist: Patient identified, Emergency Drugs available, Suction available, Patient being monitored and Timeout performed Patient Re-evaluated:Patient Re-evaluated prior to inductionOxygen Delivery Method: Circle system utilized Preoxygenation: Pre-oxygenation with 100% oxygen Intubation Type: IV induction Ventilation: Mask ventilation without difficulty LMA: LMA inserted LMA Size: 4.0 Number of attempts: 1 Placement Confirmation: positive ETCO2,  CO2 detector and breath sounds checked- equal and bilateral Tube secured with: Tape Dental Injury: Teeth and Oropharynx as per pre-operative assessment

## 2016-02-01 NOTE — H&P (Signed)
Christina Henry is an 75 y.o. female S/P partial nephrectomy for renal carcinoma was noted to have thick, inhomogeneous endometrium on a surveillance CT. EMB in office is benign. U/S in office notes a 8.8 mm inhomogeneous EM stripe. No bleeding. No pelvic pain.  Pertinent Gynecological History: Menses: post-menopausal Bleeding: N/A Contraception: none DES exposure: denies Blood transfusions: none Sexually transmitted diseases: no past history Previous GYN Procedures: none  Last mammogram: unknown Date: N/A Last pap: normal Date: 2017 OB History: G2, P1   Menstrual History: Menarche age: unknown  No LMP recorded. Patient is postmenopausal.    Past Medical History  Diagnosis Date  . Osteopenia   . Renal cell carcinoma ([place]) 12/14    clear cell/ partial nephrectomy  . Arthritis     Past Surgical History  Procedure Laterality Date  . Robotic assited partial nephrectomy  12/14    renal clear cell carcinoma [facility]  . Tubal ligation    . Tonsillectomy      No family history on file.  Social History:  reports that she has never smoked. She has never used smokeless tobacco. She reports that she drinks alcohol. She reports that she does not use illicit drugs.  Allergies:  Allergies  Allergen Reactions  . Demerol Nausea Only  . Meperidine Nausea Only    No prescriptions prior to admission    Review of Systems  Constitutional: Negative for fever.    There were no vitals taken for this visit. Physical Exam  Cardiovascular: Normal rate and regular rhythm.   Respiratory: Effort normal.  GI: Soft.    No results found for this or any previous visit (from the past 24 hour(s)).  No results found.  Assessment/Plan: 75 yo with thick EM stripe D/W patient H/S, D&C, possible Myosure resection Risks reviewed including infection, organ damage, bleeding/transfusion-HIV/Hep, DVT/PE, pneumonia. Patient states she understands and agrees  [facility],Nick Armel E 02/01/2016, 6:44  AM

## 2016-02-01 NOTE — Op Note (Signed)
Christina Henry, Christina Henry               ACCOUNT NO.:  [id number]  MEDICAL RECORD NO.:  [id number]  LOCATION:  [facility]                          FACILITY:  [facility]  PHYSICIAN:  Daleen Bo. Gaetano Net, M.D. DATE OF BIRTH:  Sep 23, 1941  DATE OF PROCEDURE:  02/01/2016 DATE OF DISCHARGE:                              OPERATIVE REPORT   PREOPERATIVE DIAGNOSIS:  Thickened endometrium.  POSTOPERATIVE DIAGNOSIS:  Endometrial polyp.  PROCEDURE:  Hysteroscopy with dilation and curettage.  SURGEON:  Daleen Bo. Gaetano Net, M.D.  ANESTHESIA:  General with LMA.  ESTIMATED BLOOD LOSS:  Drops.  I'S AND O'S:  Distending media, 70 mL deficit.  SPECIMENS:  Endometrial curettings and endometrial polyp, both to Pathology.  INDICATIONS AND CONSENT:  The patient is a 75 year old patient with a history of renal carcinoma.  On a surveillance CAT scan, she had an incidental finding of thickened endometrium.  Subsequent ultrasound confirmed this.  Endometrial biopsy in the office was benign.  Repeat ultrasound in the office again confirmed a homogeneous thickened endometrial stripe of about 8.8 mm.  Hysteroscopy with D and C is recommended.  Potential risks and complications were reviewed preoperatively, including but not limited to infection, uterine perforation, organ damage, bleeding requiring transfusion of blood products with HIV and hepatitis acquisition, DVT, PE, pneumonia.  All questions were answered.  Consent was signed on the chart.  FINDINGS:  Both fallopian tube ostia identified.  There is an approximately 1.5 cm polypoid structure on a narrow stalk arising from the superior portion of the endometrial cavity.  Remainder of the endometrium appears atrophic.  Both fallopian tube ostia were identified.  DESCRIPTION OF PROCEDURE:  The patient was taken to the operating room where she was identified, placed in a dorsal supine position, and general anesthesia was induced via LMA.  She was placed in a  dorsal lithotomy position.  She was prepped, bladder was straight catheterized and draped in sterile fashion.  Time-out was undertaken.  Bivalve speculum was placed in the vagina.  Anterior cervical lip was injected with 1% plain lidocaine and grasped with a single-tooth tenaculum.  A paracervical block was placed at 2 o'clock, 4 o'clock, 5 o'clock, 7 o'clock, 8 o'clock, 10 o'clock positions with approximately 10 mL total of 1% plain lidocaine.  Cervix was gently progressively dilated. Diagnostic hysteroscope was placed in the endocervical canal, advanced under direct visualization using distending media.  The above findings were noted.  Hysteroscope was withdrawn.  Then, using a narrow pair of grasping forceps, the polyp was removed without difficulty.  Sharp curettage was carried out for basically no tissue following this.  Reinspection with the hysteroscope reveals the cavity to be clean with good hemostasis.  All instruments were removed.  All counts were correct.  The patient was awakened and was taken to recovery room in stable condition.     Daleen Bo Gaetano Net, M.D.     JET/MEDQ  D:  02/01/2016  T:  02/01/2016  Job:  [id number]

## 2016-02-01 NOTE — Progress Notes (Signed)
No change to H&P per patient history Reviewed with patient procedure-H/S, D&C Patient states she understands and agrees

## 2016-02-01 NOTE — Transfer of Care (Signed)
Immediate Anesthesia Transfer of Care Note  Patient: Christina Henry  Procedure(s) Performed: Procedure(s): DILATATION AND CURETTAGE /HYSTEROSCOPY (N/A)  Patient Location: PACU  Anesthesia Type:General  Level of Consciousness: awake, alert  and oriented  Airway & Oxygen Therapy: Patient Spontanous Breathing and Patient connected to nasal cannula oxygen  Post-op Assessment: Report given to RN and Post -op Vital signs reviewed and stable  Post vital signs: Reviewed and stable  Last Vitals:  Filed Vitals:   02/01/16 0707  BP: 134/75  Pulse: 65  Temp: 36.7 C  Resp: 20    Complications: No apparent anesthesia complications

## 2016-02-01 NOTE — Discharge Instructions (Signed)

## 2016-02-01 NOTE — Anesthesia Postprocedure Evaluation (Signed)
Anesthesia Post Note  Patient: Christina Henry  Procedure(s) Performed: Procedure(s) (LRB): DILATATION AND CURETTAGE /HYSTEROSCOPY (N/A)  Patient location during evaluation: PACU Anesthesia Type: General Level of consciousness: awake and alert Pain management: pain level controlled Vital Signs Assessment: post-procedure vital signs reviewed and stable Respiratory status: spontaneous breathing, nonlabored ventilation, respiratory function stable and patient connected to nasal cannula oxygen Cardiovascular status: blood pressure returned to baseline and stable Postop Assessment: no signs of nausea or vomiting Anesthetic complications: no    Last Vitals:  Filed Vitals:   02/01/16 0856 02/01/16 0900  BP: 121/75 134/76  Pulse: 77 73  Temp: 36.7 C   Resp: 17 14    Last Pain: There were no vitals filed for this visit.               Alexis Frock

## 2016-02-02 ENCOUNTER — Encounter (HOSPITAL_COMMUNITY): Payer: Self-pay | Admitting: Obstetrics and Gynecology

## 2016-02-28 ENCOUNTER — Emergency Department (HOSPITAL_COMMUNITY)
Admission: EM | Admit: 2016-02-28 | Discharge: 2016-02-28 | Disposition: A | Discharge status: Home / Self Care | Payer: Medicare Other | Attending: Emergency Medicine | Admitting: Emergency Medicine

## 2016-02-28 ENCOUNTER — Emergency Department (HOSPITAL_COMMUNITY): Payer: Medicare Other

## 2016-02-28 ENCOUNTER — Encounter (HOSPITAL_COMMUNITY): Payer: Self-pay | Admitting: Emergency Medicine

## 2016-02-28 DIAGNOSIS — S82001A Unspecified fracture of right patella, initial encounter for closed fracture: Secondary | ICD-10-CM | POA: Insufficient documentation

## 2016-02-28 DIAGNOSIS — Z79899 Other long term (current) drug therapy: Secondary | ICD-10-CM | POA: Insufficient documentation

## 2016-02-28 DIAGNOSIS — S32010A Wedge compression fracture of first lumbar vertebra, initial encounter for closed fracture: Secondary | ICD-10-CM | POA: Insufficient documentation

## 2016-02-28 DIAGNOSIS — Y92410 Unspecified street and highway as the place of occurrence of the external cause: Secondary | ICD-10-CM | POA: Diagnosis not present

## 2016-02-28 DIAGNOSIS — M1990 Unspecified osteoarthritis, unspecified site: Secondary | ICD-10-CM | POA: Diagnosis not present

## 2016-02-28 DIAGNOSIS — T1490XA Injury, unspecified, initial encounter: Secondary | ICD-10-CM

## 2016-02-28 DIAGNOSIS — Y998 Other external cause status: Secondary | ICD-10-CM | POA: Insufficient documentation

## 2016-02-28 DIAGNOSIS — Y9389 Activity, other specified: Secondary | ICD-10-CM | POA: Diagnosis not present

## 2016-02-28 DIAGNOSIS — S93114A Dislocation of interphalangeal joint of right lesser toe(s), initial encounter: Secondary | ICD-10-CM | POA: Diagnosis not present

## 2016-02-28 DIAGNOSIS — S3991XA Unspecified injury of abdomen, initial encounter: Secondary | ICD-10-CM | POA: Insufficient documentation

## 2016-02-28 DIAGNOSIS — M8580 Other specified disorders of bone density and structure, unspecified site: Secondary | ICD-10-CM | POA: Insufficient documentation

## 2016-02-28 DIAGNOSIS — Z85528 Personal history of other malignant neoplasm of kidney: Secondary | ICD-10-CM | POA: Insufficient documentation

## 2016-02-28 DIAGNOSIS — S20112A Abrasion of breast, left breast, initial encounter: Secondary | ICD-10-CM | POA: Diagnosis not present

## 2016-02-28 DIAGNOSIS — S8991XA Unspecified injury of right lower leg, initial encounter: Secondary | ICD-10-CM | POA: Diagnosis present

## 2016-02-28 LAB — CBC WITH DIFFERENTIAL/PLATELET
Basophils Absolute: 0 10*3/uL (ref 0.0–0.1)
Basophils Relative: 0 %
EOS ABS: 0.1 10*3/uL (ref 0.0–0.7)
EOS PCT: 1 %
HCT: 35.2 % — ABNORMAL LOW (ref 36.0–46.0)
Hemoglobin: 12.2 g/dL (ref 12.0–15.0)
LYMPHS ABS: 1.2 10*3/uL (ref 0.7–4.0)
LYMPHS PCT: 10 %
MCH: 30.5 pg (ref 26.0–34.0)
MCHC: 34.7 g/dL (ref 30.0–36.0)
MCV: 88 fL (ref 78.0–100.0)
MONO ABS: 0.9 10*3/uL (ref 0.1–1.0)
MONOS PCT: 8 %
Neutro Abs: 9.4 10*3/uL — ABNORMAL HIGH (ref 1.7–7.7)
Neutrophils Relative %: 81 %
PLATELETS: 274 10*3/uL (ref 150–400)
RBC: 4 MIL/uL (ref 3.87–5.11)
RDW: 12.8 % (ref 11.5–15.5)
WBC: 11.6 10*3/uL — AB (ref 4.0–10.5)

## 2016-02-28 LAB — COMPREHENSIVE METABOLIC PANEL
ALT: 25 U/L (ref 14–54)
ANION GAP: 8 (ref 5–15)
AST: 41 U/L (ref 15–41)
Albumin: 4.3 g/dL (ref 3.5–5.0)
Alkaline Phosphatase: 45 U/L (ref 38–126)
BUN: 18 mg/dL (ref 6–20)
CALCIUM: 9.1 mg/dL (ref 8.9–10.3)
CHLORIDE: 102 mmol/L (ref 101–111)
CO2: 25 mmol/L (ref 22–32)
CREATININE: 0.95 mg/dL (ref 0.44–1.00)
GFR, EST NON AFRICAN AMERICAN: 58 mL/min — AB (ref >60)
Glucose, Bld: 120 mg/dL — ABNORMAL HIGH (ref 65–99)
Potassium: 4.1 mmol/L (ref 3.5–5.1)
SODIUM: 135 mmol/L (ref 135–145)
Total Bilirubin: 0.9 mg/dL (ref 0.3–1.2)
Total Protein: 6.3 g/dL — ABNORMAL LOW (ref 6.5–8.1)

## 2016-02-28 LAB — URINALYSIS, ROUTINE W REFLEX MICROSCOPIC
Bilirubin Urine: NEGATIVE
GLUCOSE, UA: NEGATIVE mg/dL
HGB URINE DIPSTICK: NEGATIVE
Ketones, ur: NEGATIVE mg/dL
Leukocytes, UA: NEGATIVE
Nitrite: NEGATIVE
PH: 7 (ref 5.0–8.0)
Protein, ur: NEGATIVE mg/dL
SPECIFIC GRAVITY, URINE: 1.019 (ref 1.005–1.030)

## 2016-02-28 LAB — LIPASE, BLOOD: LIPASE: 63 U/L — AB (ref 11–51)

## 2016-02-28 MED ORDER — HYDROCODONE-ACETAMINOPHEN 5-325 MG PO TABS: 1.0000 | ORAL_TABLET | ORAL | Status: DC | PRN

## 2016-02-28 MED ORDER — SODIUM CHLORIDE 0.9 % IV BOLUS (SEPSIS)
500.0000 mL | Freq: Once | INTRAVENOUS | Status: AC
  Administered 2016-02-28: 500 mL via INTRAVENOUS

## 2016-02-28 MED ORDER — MORPHINE SULFATE (PF) 2 MG/ML IV SOLN: 2.0000 mg | INTRAVENOUS | Status: DC | PRN

## 2016-02-28 MED ORDER — IOPAMIDOL (ISOVUE-300) INJECTION 61%
INTRAVENOUS | Status: AC
  Administered 2016-02-28: 100 mL
  Filled 2016-02-28: qty 100

## 2016-02-28 NOTE — Progress Notes (Signed)
Orthopedic Tech Progress Note Patient Details:  Christina Henry [birth year] [id number]  Ortho Devices Type of Ortho Device: Knee Immobilizer Ortho Device/Splint Interventions: Application   [place] Pink 02/28/2016, 1:33 PM
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
## 2016-02-28 NOTE — ED Notes (Signed)
Pt taken to CT.

## 2016-02-28 NOTE — ED Notes (Signed)
Pt requested to use bedside commode while being discharged.  While sitting on bedside commode pt st's she feels like she is going to pass out.   Pt did have syncopal episode but did not fall.  Pt placed back on stretcher.  Color pale, skin diaphoretic.  Sam, PA at bedside.

## 2016-02-28 NOTE — ED Notes (Signed)
Patient transported to X-ray 
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
## 2016-03-02 ENCOUNTER — Other Ambulatory Visit: Payer: Self-pay | Admitting: Orthopaedic Surgery

## 2016-03-05 ENCOUNTER — Ambulatory Visit (HOSPITAL_COMMUNITY): Payer: Medicare Other | Admitting: Certified Registered"

## 2016-03-05 ENCOUNTER — Encounter (HOSPITAL_COMMUNITY): Payer: Self-pay | Admitting: Surgery

## 2016-03-05 ENCOUNTER — Ambulatory Visit (HOSPITAL_COMMUNITY)
Admission: RE | Admit: 2016-03-05 | Discharge: 2016-03-06 | Disposition: A | Discharge status: Home / Self Care | Payer: Medicare Other | Source: Ambulatory Visit | Attending: Orthopaedic Surgery | Admitting: Orthopaedic Surgery

## 2016-03-05 ENCOUNTER — Encounter (HOSPITAL_COMMUNITY)
Admission: RE | Disposition: A | Discharge status: Home / Self Care | Payer: Self-pay | Source: Ambulatory Visit | Attending: Orthopaedic Surgery

## 2016-03-05 ENCOUNTER — Ambulatory Visit (HOSPITAL_COMMUNITY): Payer: Medicare Other

## 2016-03-05 DIAGNOSIS — Z8781 Personal history of (healed) traumatic fracture: Secondary | ICD-10-CM | POA: Diagnosis present

## 2016-03-05 DIAGNOSIS — R262 Difficulty in walking, not elsewhere classified: Secondary | ICD-10-CM | POA: Insufficient documentation

## 2016-03-05 DIAGNOSIS — S82001A Unspecified fracture of right patella, initial encounter for closed fracture: Secondary | ICD-10-CM | POA: Diagnosis present

## 2016-03-05 DIAGNOSIS — R531 Weakness: Secondary | ICD-10-CM | POA: Insufficient documentation

## 2016-03-05 DIAGNOSIS — Z419 Encounter for procedure for purposes other than remedying health state, unspecified: Secondary | ICD-10-CM

## 2016-03-05 DIAGNOSIS — Z85528 Personal history of other malignant neoplasm of kidney: Secondary | ICD-10-CM | POA: Insufficient documentation

## 2016-03-05 DIAGNOSIS — S82009A Unspecified fracture of unspecified patella, initial encounter for closed fracture: Secondary | ICD-10-CM | POA: Insufficient documentation

## 2016-03-05 HISTORY — PX: ORIF PATELLA: SHX5033

## 2016-03-05 LAB — CBC
HEMATOCRIT: 29.1 % — AB (ref 36.0–46.0)
HEMOGLOBIN: 9.9 g/dL — AB (ref 12.0–15.0)
MCH: 29.5 pg (ref 26.0–34.0)
MCHC: 34 g/dL (ref 30.0–36.0)
MCV: 86.6 fL (ref 78.0–100.0)
Platelets: 236 10*3/uL (ref 150–400)
RBC: 3.36 MIL/uL — AB (ref 3.87–5.11)
RDW: 12.6 % (ref 11.5–15.5)
WBC: 9.9 10*3/uL (ref 4.0–10.5)

## 2016-03-05 LAB — CREATININE, SERUM: CREATININE: 0.77 mg/dL (ref 0.44–1.00)

## 2016-03-05 LAB — PROTIME-INR
INR: 1.08 (ref 0.00–1.49)
PROTHROMBIN TIME: 14.2 s (ref 11.6–15.2)

## 2016-03-05 SURGERY — OPEN REDUCTION INTERNAL FIXATION (ORIF) PATELLA
Anesthesia: General | Site: Knee | Laterality: Right

## 2016-03-05 MED ORDER — CHLORHEXIDINE GLUCONATE 4 % EX LIQD: 60.0000 mL | Freq: Once | CUTANEOUS | Status: DC

## 2016-03-05 MED ORDER — EPHEDRINE 5 MG/ML INJ
INTRAVENOUS | Status: AC
Start: 2016-03-05 — End: 2016-03-05
  Filled 2016-03-05: qty 10

## 2016-03-05 MED ORDER — METOCLOPRAMIDE HCL 10 MG PO TABS: 5.0000 mg | ORAL_TABLET | Freq: Three times a day (TID) | ORAL | Status: DC | PRN

## 2016-03-05 MED ORDER — LACTATED RINGERS IV SOLN
INTRAVENOUS | Status: DC
Start: 2016-03-05 — End: 2016-03-05

## 2016-03-05 MED ORDER — ONDANSETRON HCL 4 MG/2ML IJ SOLN: 4.0000 mg | Freq: Four times a day (QID) | INTRAMUSCULAR | Status: DC | PRN

## 2016-03-05 MED ORDER — MIDAZOLAM HCL 2 MG/2ML IJ SOLN
INTRAMUSCULAR | Status: AC
  Filled 2016-03-05: qty 2

## 2016-03-05 MED ORDER — LIDOCAINE HCL (CARDIAC) 20 MG/ML IV SOLN
INTRAVENOUS | Status: DC | PRN
  Administered 2016-03-05: 60 mg via INTRAVENOUS

## 2016-03-05 MED ORDER — SODIUM CHLORIDE 0.45 % IV SOLN
INTRAVENOUS | Status: DC
  Administered 2016-03-05: 22:00:00 via INTRAVENOUS

## 2016-03-05 MED ORDER — METOCLOPRAMIDE HCL 5 MG/ML IJ SOLN: 5.0000 mg | Freq: Three times a day (TID) | INTRAMUSCULAR | Status: DC | PRN

## 2016-03-05 MED ORDER — CALCIUM CARBONATE-VITAMIN D 500-200 MG-UNIT PO TABS
1.0000 | ORAL_TABLET | Freq: Two times a day (BID) | ORAL | Status: DC
  Administered 2016-03-05 – 2016-03-06 (×2): 1 via ORAL
  Filled 2016-03-05 (×2): qty 1

## 2016-03-05 MED ORDER — LACTATED RINGERS IV SOLN
INTRAVENOUS | Status: DC
  Administered 2016-03-05 (×2): via INTRAVENOUS

## 2016-03-05 MED ORDER — FENTANYL CITRATE (PF) 100 MCG/2ML IJ SOLN
INTRAMUSCULAR | Status: AC
  Filled 2016-03-05: qty 2

## 2016-03-05 MED ORDER — CEFAZOLIN SODIUM-DEXTROSE 2-4 GM/100ML-% IV SOLN
INTRAVENOUS | Status: AC
Start: 2016-03-05 — End: 2016-03-06
  Filled 2016-03-05: qty 100

## 2016-03-05 MED ORDER — MEPERIDINE HCL 25 MG/ML IJ SOLN: 6.2500 mg | INTRAMUSCULAR | Status: DC | PRN

## 2016-03-05 MED ORDER — PHENYLEPHRINE 40 MCG/ML (10ML) SYRINGE FOR IV PUSH (FOR BLOOD PRESSURE SUPPORT)
PREFILLED_SYRINGE | INTRAVENOUS | Status: AC
  Filled 2016-03-05: qty 10

## 2016-03-05 MED ORDER — NAPROXEN 250 MG PO TABS
250.0000 mg | ORAL_TABLET | Freq: Two times a day (BID) | ORAL | Status: DC
  Administered 2016-03-06 (×2): 250 mg via ORAL
  Filled 2016-03-05 (×2): qty 1

## 2016-03-05 MED ORDER — ONDANSETRON HCL 4 MG PO TABS: 4.0000 mg | ORAL_TABLET | Freq: Four times a day (QID) | ORAL | Status: DC | PRN

## 2016-03-05 MED ORDER — MIDAZOLAM HCL 5 MG/5ML IJ SOLN
INTRAMUSCULAR | Status: DC | PRN
  Administered 2016-03-05: 1 mg via INTRAVENOUS

## 2016-03-05 MED ORDER — HYDROCODONE-ACETAMINOPHEN 5-325 MG PO TABS
1.0000 | ORAL_TABLET | ORAL | Status: DC | PRN
  Administered 2016-03-06: 1 via ORAL
  Filled 2016-03-05: qty 1

## 2016-03-05 MED ORDER — FENTANYL CITRATE (PF) 100 MCG/2ML IJ SOLN
INTRAMUSCULAR | Status: DC | PRN
  Administered 2016-03-05 (×2): 50 ug via INTRAVENOUS

## 2016-03-05 MED ORDER — FENTANYL CITRATE (PF) 250 MCG/5ML IJ SOLN
INTRAMUSCULAR | Status: AC
  Filled 2016-03-05: qty 5

## 2016-03-05 MED ORDER — CALCIUM CARBONATE-VIT D-MIN 600-400 MG-UNIT PO TABS: 1.0000 | ORAL_TABLET | Freq: Two times a day (BID) | ORAL | Status: DC

## 2016-03-05 MED ORDER — CEFAZOLIN SODIUM-DEXTROSE 2-4 GM/100ML-% IV SOLN
2.0000 g | INTRAVENOUS | Status: AC
  Administered 2016-03-05: 2 g via INTRAVENOUS

## 2016-03-05 MED ORDER — SUCCINYLCHOLINE CHLORIDE 200 MG/10ML IV SOSY
PREFILLED_SYRINGE | INTRAVENOUS | Status: AC
  Filled 2016-03-05: qty 10

## 2016-03-05 MED ORDER — PROPOFOL 10 MG/ML IV BOLUS
INTRAVENOUS | Status: DC | PRN
  Administered 2016-03-05: 100 mg via INTRAVENOUS
  Administered 2016-03-05: 30 mg via INTRAVENOUS
  Administered 2016-03-05: 20 mg via INTRAVENOUS

## 2016-03-05 MED ORDER — FENTANYL CITRATE (PF) 100 MCG/2ML IJ SOLN
25.0000 ug | INTRAMUSCULAR | Status: DC | PRN
Start: 2016-03-05 — End: 2016-03-05

## 2016-03-05 MED ORDER — PHENYLEPHRINE HCL 10 MG/ML IJ SOLN
INTRAMUSCULAR | Status: DC | PRN
  Administered 2016-03-05 (×2): 80 ug via INTRAVENOUS

## 2016-03-05 MED ORDER — BUPIVACAINE HCL (PF) 0.25 % IJ SOLN
INTRAMUSCULAR | Status: AC
  Filled 2016-03-05: qty 30

## 2016-03-05 MED ORDER — BUPIVACAINE-EPINEPHRINE (PF) 0.5% -1:200000 IJ SOLN
INTRAMUSCULAR | Status: DC | PRN
Start: 2016-03-05 — End: 2016-03-05
  Administered 2016-03-05: 30 mL via PERINEURAL

## 2016-03-05 MED ORDER — ENOXAPARIN SODIUM 40 MG/0.4ML ~~LOC~~ SOLN
40.0000 mg | SUBCUTANEOUS | Status: DC
  Administered 2016-03-06: 40 mg via SUBCUTANEOUS
  Filled 2016-03-05: qty 0.4

## 2016-03-05 MED ORDER — OXYCODONE HCL 5 MG PO TABS
5.0000 mg | ORAL_TABLET | ORAL | Status: DC | PRN
Start: 2016-03-05 — End: 2016-03-06

## 2016-03-05 MED ORDER — MORPHINE SULFATE (PF) 2 MG/ML IV SOLN: 2.0000 mg | INTRAVENOUS | Status: DC | PRN

## 2016-03-05 MED ORDER — ROCURONIUM BROMIDE 50 MG/5ML IV SOLN
INTRAVENOUS | Status: AC
  Filled 2016-03-05: qty 1

## 2016-03-05 MED ORDER — PROPOFOL 10 MG/ML IV BOLUS
INTRAVENOUS | Status: AC
  Filled 2016-03-05: qty 20

## 2016-03-05 MED ORDER — BUPIVACAINE HCL (PF) 0.25 % IJ SOLN
INTRAMUSCULAR | Status: DC | PRN
  Administered 2016-03-05: 10 mL

## 2016-03-05 MED ORDER — ACETAMINOPHEN 650 MG RE SUPP: 650.0000 mg | Freq: Four times a day (QID) | RECTAL | Status: DC | PRN

## 2016-03-05 MED ORDER — NAPROXEN SODIUM 220 MG PO TABS: 220.0000 mg | ORAL_TABLET | Freq: Two times a day (BID) | ORAL | Status: DC | PRN

## 2016-03-05 MED ORDER — PROCHLORPERAZINE EDISYLATE 5 MG/ML IJ SOLN: 10.0000 mg | INTRAMUSCULAR | Status: DC | PRN

## 2016-03-05 MED ORDER — ACETAMINOPHEN 325 MG PO TABS: 650.0000 mg | ORAL_TABLET | Freq: Four times a day (QID) | ORAL | Status: DC | PRN

## 2016-03-05 MED ORDER — ONDANSETRON HCL 4 MG/2ML IJ SOLN
INTRAMUSCULAR | Status: DC | PRN
  Administered 2016-03-05: 4 mg via INTRAVENOUS

## 2016-03-05 MED ORDER — LIDOCAINE 2% (20 MG/ML) 5 ML SYRINGE
INTRAMUSCULAR | Status: AC
  Filled 2016-03-05: qty 5

## 2016-03-05 SURGICAL SUPPLY — 61 items
BANDAGE ELASTIC 6 VELCRO ST LF (GAUZE/BANDAGES/DRESSINGS) ×2 IMPLANT
BENZOIN TINCTURE PRP APPL 2/3 (GAUZE/BANDAGES/DRESSINGS) ×2 IMPLANT
BLADE SURG 15 STRL LF DISP TIS (BLADE) ×1 IMPLANT
BLADE SURG 15 STRL SS (BLADE) ×1
BNDG COHESIVE 6X5 TAN STRL LF (GAUZE/BANDAGES/DRESSINGS) ×2 IMPLANT
CANISTER SUCTION 2500CC (MISCELLANEOUS) ×2 IMPLANT
COVER SURGICAL LIGHT HANDLE (MISCELLANEOUS) ×2 IMPLANT
CUFF TOURNIQUET SINGLE 34IN LL (TOURNIQUET CUFF) ×2 IMPLANT
DECANTER SPIKE VIAL GLASS SM (MISCELLANEOUS) ×2 IMPLANT
DRAPE C-ARM 42X72 X-RAY (DRAPES) ×2 IMPLANT
DRAPE C-ARMOR (DRAPES) ×2 IMPLANT
DRAPE U-SHAPE 47X51 STRL (DRAPES) ×2 IMPLANT
DRSG EMULSION OIL 3X3 NADH (GAUZE/BANDAGES/DRESSINGS) ×2 IMPLANT
DRSG PAD ABDOMINAL 8X10 ST (GAUZE/BANDAGES/DRESSINGS) ×2 IMPLANT
DURAPREP 26ML APPLICATOR (WOUND CARE) ×2 IMPLANT
ELECT REM PT RETURN 9FT ADLT (ELECTROSURGICAL) ×2
ELECTRODE REM PT RTRN 9FT ADLT (ELECTROSURGICAL) ×1 IMPLANT
GLOVE BIOGEL PI IND STRL 8 (GLOVE) ×2 IMPLANT
GLOVE BIOGEL PI INDICATOR 8 (GLOVE) ×2
GLOVE ORTHO TXT STRL SZ7.5 (GLOVE) ×4 IMPLANT
GOWN STRL REUS W/ TWL LRG LVL3 (GOWN DISPOSABLE) ×1 IMPLANT
GOWN STRL REUS W/ TWL XL LVL3 (GOWN DISPOSABLE) ×1 IMPLANT
GOWN STRL REUS W/TWL 2XL LVL3 (GOWN DISPOSABLE) ×2 IMPLANT
GOWN STRL REUS W/TWL LRG LVL3 (GOWN DISPOSABLE) ×1
GOWN STRL REUS W/TWL XL LVL3 (GOWN DISPOSABLE) ×1
GUIDEWIRE PIN ORTH 6X1.6XSMTH (WIRE) ×1 IMPLANT
K-WIRE 1.6 (WIRE) ×1
KIT BASIN OR (CUSTOM PROCEDURE TRAY) ×2 IMPLANT
KIT ROOM TURNOVER OR (KITS) ×2 IMPLANT
NEEDLE 22X1 1/2 (OR ONLY) (NEEDLE) ×2 IMPLANT
NEEDLE HYPO 25GX1X1/2 BEV (NEEDLE) ×2 IMPLANT
NS IRRIG 1000ML POUR BTL (IV SOLUTION) ×2 IMPLANT
PACK ORTHO EXTREMITY (CUSTOM PROCEDURE TRAY) ×2 IMPLANT
PAD ARMBOARD 7.5X6 YLW CONV (MISCELLANEOUS) ×4 IMPLANT
PAD CAST 4YDX4 CTTN HI CHSV (CAST SUPPLIES) ×1 IMPLANT
PADDING CAST ABS 6INX4YD NS (CAST SUPPLIES) ×1
PADDING CAST ABS COTTON 6X4 NS (CAST SUPPLIES) ×1 IMPLANT
PADDING CAST COTTON 4X4 STRL (CAST SUPPLIES) ×1
SCREW CANN 1/3 THRD RVRS CT (Screw) ×2 IMPLANT
SCREW CANN 1/3 THRD RVRS CUT (Screw) ×1 IMPLANT
SCREW CANNULATED 4.0X40 (Screw) ×2 IMPLANT
SCREW CANNULATED 4.0X55MM (Screw) ×1 IMPLANT
SPONGE GAUZE 4X4 12PLY STER LF (GAUZE/BANDAGES/DRESSINGS) ×2 IMPLANT
SPONGE LAP 4X18 X RAY DECT (DISPOSABLE) ×2 IMPLANT
STAPLER VISISTAT 35W (STAPLE) ×2 IMPLANT
STOCKINETTE IMPERVIOUS LG (DRAPES) ×2 IMPLANT
STRIP CLOSURE SKIN 1/2X4 (GAUZE/BANDAGES/DRESSINGS) ×2 IMPLANT
SUCTION FRAZIER HANDLE 10FR (MISCELLANEOUS) ×1
SUCTION TUBE FRAZIER 10FR DISP (MISCELLANEOUS) ×1 IMPLANT
SUT ETHIBOND NAB CT1 #1 30IN (SUTURE) ×2 IMPLANT
SUT VIC AB 0 CT1 27 (SUTURE) ×1
SUT VIC AB 0 CT1 27XBRD ANBCTR (SUTURE) ×1 IMPLANT
SUT VIC AB 2-0 CT1 27 (SUTURE) ×1
SUT VIC AB 2-0 CT1 TAPERPNT 27 (SUTURE) ×1 IMPLANT
SYR CONTROL 10ML LL (SYRINGE) ×2 IMPLANT
TOWEL OR 17X24 6PK STRL BLUE (TOWEL DISPOSABLE) ×2 IMPLANT
TOWEL OR 17X26 10 PK STRL BLUE (TOWEL DISPOSABLE) ×2 IMPLANT
TUBE CONNECTING 12X1/4 (SUCTIONS) ×2 IMPLANT
UNDERPAD 30X30 INCONTINENT (UNDERPADS AND DIAPERS) ×2 IMPLANT
WIRE GUIDE MODEL 22X500MM (WIRE) ×2 IMPLANT
YANKAUER SUCT BULB TIP NO VENT (SUCTIONS) IMPLANT

## 2016-03-05 NOTE — Anesthesia Procedure Notes (Addendum)
Anesthesia Regional Block:  Femoral nerve block  Pre-Anesthetic Checklist: ,, timeout performed, Correct Patient, Correct Site, Correct Laterality, Correct Procedure, Correct Position, site marked, Risks and benefits discussed, Surgical consent,  Pre-op evaluation,  Post-op pain management  Laterality: Right  Prep: chloraprep       Needles:  Injection technique: Single-shot  Needle Type: Stimulator Needle - 80     Needle Length: 9cm 9 cm Needle Gauge: 22 and 22 G  Needle insertion depth: 6 cm   Additional Needles:  Procedures: ultrasound guided (picture in chart) and nerve stimulator Femoral nerve block  Nerve Stimulator or Paresthesia:  Response: Patellar snap, 0.5 mA,   Additional Responses:   Narrative:  Injection made incrementally with aspirations every 5 mL.  Performed by: Personally  Anesthesiologist: Nolon Nations  Additional Notes: BP cuff, EKG monitors applied. Sedation begun. Femoral artery palpated for location of nerve. After nerve location verified with U/S, anesthetic injected incrementally, slowly, and after negative aspirations under direct u/s guidance. Good perineural spread. Patient tolerated well.   Procedure Name: LMA Insertion Date/Time: 03/05/2016 7:01 PM Performed by: Manuela Schwartz B Pre-anesthesia Checklist: Patient identified, Emergency Drugs available, Suction available, Patient being monitored and Timeout performed Patient Re-evaluated:Patient Re-evaluated prior to inductionOxygen Delivery Method: Circle system utilized Preoxygenation: Pre-oxygenation with 100% oxygen Intubation Type: IV induction LMA: LMA inserted LMA Size: 4.0 Number of attempts: 1 Placement Confirmation: positive ETCO2 and breath sounds checked- equal and bilateral Tube secured with: Tape Dental Injury: Teeth and Oropharynx as per pre-operative assessment

## 2016-03-05 NOTE — Interval H&P Note (Signed)
History and Physical Interval Note:  03/05/2016 6:20 PM  Christina Henry  has presented today for surgery, with the diagnosis of Right Displaced Patella Fracture  The various methods of treatment have been discussed with the patient and family. After consideration of risks, benefits and other options for treatment, the patient has consented to  Procedure(s): OPEN REDUCTION INTERNAL (ORIF) FIXATION PATELLA (Right) as a surgical intervention .  The patient's history has been reviewed, patient examined, no change in status, stable for surgery.  I have reviewed the patient's chart and labs.  Questions were answered to the patient's satisfaction.     Damyen Knoll C

## 2016-03-05 NOTE — Anesthesia Preprocedure Evaluation (Addendum)
Anesthesia Evaluation  Patient identified by MRN, date of birth, ID band Patient awake    Reviewed: Allergy & Precautions, NPO status , Patient's Chart, lab work & pertinent test results  Airway Mallampati: II  TM Distance: >3 FB Neck ROM: Full    Dental no notable dental hx.    Pulmonary neg pulmonary ROS,    Pulmonary exam normal breath sounds clear to auscultation       Cardiovascular negative cardio ROS Normal cardiovascular exam Rhythm:Regular Rate:Normal     Neuro/Psych  Neuromuscular disease negative psych ROS   GI/Hepatic negative GI ROS, Neg liver ROS,   Endo/Other  negative endocrine ROS  Renal/GU Renal disease  negative genitourinary   Musculoskeletal  (+) Arthritis ,   Abdominal   Peds negative pediatric ROS (+)  Hematology negative hematology ROS (+)   Anesthesia Other Findings - Renal Cell Carcinoma  Reproductive/Obstetrics negative OB ROS                            Lab Results  Component Value Date   WBC 11.6* 02/28/2016   HGB 12.2 02/28/2016   HCT 35.2* 02/28/2016   MCV 88.0 02/28/2016   PLT 274 02/28/2016   Lab Results  Component Value Date   CREATININE 0.95 02/28/2016   BUN 18 02/28/2016   NA 135 02/28/2016   K 4.1 02/28/2016   CL 102 02/28/2016   CO2 25 02/28/2016   Lab Results  Component Value Date   INR 1.08 03/05/2016   01/2016 EKG: normal sinus rhythm.   Anesthesia Physical Anesthesia Plan  ASA: II  Anesthesia Plan: General   Post-op Pain Management: GA combined w/ Regional for post-op pain   Induction: Intravenous  Airway Management Planned: LMA  Additional Equipment:   Intra-op Plan:   Post-operative Plan: Extubation in OR  Informed Consent: I have reviewed the patients History and Physical, chart, labs and discussed the procedure including the risks, benefits and alternatives for the proposed anesthesia with the patient or  authorized representative who has indicated his/her understanding and acceptance.   Dental advisory given  Plan Discussed with: CRNA  Anesthesia Plan Comments:       Anesthesia Quick Evaluation

## 2016-03-05 NOTE — Transfer of Care (Signed)
Immediate Anesthesia Transfer of Care Note  Patient: Christina Henry  Procedure(s) Performed: Procedure(s): OPEN REDUCTION INTERNAL (ORIF) FIXATION PATELLA (Right)  Patient Location: PACU  Anesthesia Type:General  Level of Consciousness: awake, alert  and oriented  Airway & Oxygen Therapy: Patient Spontanous Breathing  Post-op Assessment: Report given to RN and Post -op Vital signs reviewed and stable  Post vital signs: Reviewed and stable  Last Vitals:  Filed Vitals:   03/05/16 1420  BP: 151/70  Pulse: 74  Temp: 36.7 C  Resp: 16    Last Pain: There were no vitals filed for this visit.    Patients Stated Pain Goal: 4 ([id number])  Complications: No apparent anesthesia complications

## 2016-03-05 NOTE — Brief Op Note (Signed)
03/05/2016  8:52 PM  PATIENT:  Christina Henry  75 y.o. female  PRE-OPERATIVE DIAGNOSIS:  Right Displaced Patella Fracture  POST-OPERATIVE DIAGNOSIS:  Right Displaced Patella Fracture  PROCEDURE:  Procedure(s): OPEN REDUCTION INTERNAL (ORIF) FIXATION PATELLA (Right)  SURGEON:  Surgeon(s) and Role:    * Marybelle Killings, MD - Primary  PHYSICIAN ASSISTANT:   ASSISTANTS: none   ANESTHESIA:   local, regional and general  EBL:  Total I/O In: 1400 [I.V.:1400] Out: 20 [Blood:20]  BLOOD ADMINISTERED:none  DRAINS: none   LOCAL MEDICATIONS USED:  MARCAINE     SPECIMEN:  No Specimen  DISPOSITION OF SPECIMEN:  N/A  COUNTS:  YES  TOURNIQUET:   Total Tourniquet Time Documented: Thigh (Right) - 72 minutes Total: Thigh (Right) - 72 minutes   DICTATION: .Other Dictation: Dictation Number [id number]  PLAN OF CARE: Other Dictation: Dictation Number [id number]  PATIENT DISPOSITION:  PACU - hemodynamically stable.   Delay start of Pharmacological VTE agent (>24hrs) due to surgical blood loss or risk of bleeding: yes
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
## 2016-03-06 ENCOUNTER — Encounter (HOSPITAL_COMMUNITY): Payer: Self-pay | Admitting: Orthopaedic Surgery

## 2016-03-06 DIAGNOSIS — S82001A Unspecified fracture of right patella, initial encounter for closed fracture: Secondary | ICD-10-CM | POA: Diagnosis not present

## 2016-03-06 MED ORDER — HYDROCODONE-ACETAMINOPHEN 5-325 MG PO TABS: 1.0000 | ORAL_TABLET | Freq: Four times a day (QID) | ORAL | Status: DC | PRN

## 2016-03-06 NOTE — Progress Notes (Signed)
Subjective: 1 Day Post-Op Procedure(s) (LRB): OPEN REDUCTION INTERNAL (ORIF) FIXATION PATELLA (Right) Patient reports pain as 0 on 0-10 scale.    Objective: Vital signs in last 24 hours: Temp:  [98 F (36.7 C)-98.8 F (37.1 C)] 98.7 F (37.1 C) (05/23 0506) Pulse Rate:  [72-86] 86 (05/23 0507) Resp:  [13-19] 19 (05/23 0507) BP: (124-151)/(62-83) 134/67 mmHg (05/23 0507) SpO2:  [96 %-100 %] 96 % (05/23 0507) Weight:  [49.896 kg (110 lb)-53.6 kg (118 lb 2.7 oz)] 53.6 kg (118 lb 2.7 oz) (05/22 2200)  Intake/Output from previous day: 05/22 0701 - 05/23 0700 In: 1400 [I.V.:1400] Out: 1270 [Urine:1250; Blood:20] Intake/Output this shift:     Recent Labs  03/05/16 2238  HGB 9.9*    Recent Labs  03/05/16 2238  WBC 9.9  RBC 3.36*  HCT 29.1*  PLT 236    Recent Labs  03/05/16 2238  CREATININE 0.77    Recent Labs  03/05/16 1405  INR 1.08    Neurologically intact  Assessment/Plan: 1 Day Post-Op Procedure(s) (LRB): OPEN REDUCTION INTERNAL (ORIF) FIXATION PATELLA (Right) Up with therapy  TDWB.  Likely home this afternoon after therapy. Rx norco on chart  Office one week  Beckie Viscardi C 03/06/2016, 7:08 AM

## 2016-03-06 NOTE — Discharge Planning (Signed)
Patient discharged home in stable condition. Verbalizes understanding of all discharge instructions, including home medications and follow up appointments. 

## 2016-03-06 NOTE — Progress Notes (Signed)
Dr. Lorin Mercy notified of syncopal event. Current VSS. No new orders received.

## 2016-03-06 NOTE — Care Management Note (Signed)
Case Management Note  Patient Details  Name: Christina Henry MRN: [id number] Date of Birth: 1941/07/28  Subjective/Objective:                 Spoke with patient extensively in the room. She pointed to a RW she had in the room from the ED for her to take home. (patient is OIB) She states that since she will be going home as TDWB in knee immobilizer she would like to hold off on [facility] PT. She states that she goes to Dr Lorin Mercy for follow up next week and will seek [facility] PT once she is able to participate i nit and her restrictions are removed.    Action/Plan:  Patient to discharge today with knee immobilizer in place and RW.   Expected Discharge Date:                  Expected Discharge Plan:  Home/Self Care  In-House Referral:     Discharge planning Services  CM Consult  Post Acute Care Choice:  NA Choice offered to:  Patient  DME Arranged:    DME Agency:     HH Arranged:    [place] Agency:     Status of Service:  Completed, signed off  Medicare Important Message Given:    Date Medicare IM Given:    Medicare IM give by:    Date Additional Medicare IM Given:    Additional Medicare Important Message give by:     If discussed at [place] of Stay Meetings, dates discussed:    Additional Comments:  Carles Collet, RN 03/06/2016, 2:42 PM

## 2016-03-06 NOTE — Progress Notes (Signed)
Pt seen for OT evaluation this morning at 9:45 am.  Pt did very well with all adls and mobility. While in bathroom practicing shower transfers, pt reported feeling dizzy.  Pt was asked to sit on [facility] that was in shower.  Nursing was called to come to room.  Pt's feet were propped up on therapist legs and cold cloth put on neck.  Symptoms progressed and therapist called out down hallway for assist.  Therapist unable to leave pt as she was sitting on BSC in shower.  Pt then became nonresponsive, urinated on floor, and began drooling.  Pt with pulse during entire event. Nursing arrived and assisted pt to floor with therapist after calling Code Blue as pt not responding and Rapid Response not available.  As soon as pt lowered to floor she woke.  Pt answering all questions appropriately and vitals were stable at 117/70 and HR at 70.  Code Blue cancelled.  After lying on floor for appx 5 min, pt transferred into chair and back to bed to rest.  Knee was protected throughout process.  MD notified.   Jinger Neighbors, [place] [phone]

## 2016-03-06 NOTE — Anesthesia Postprocedure Evaluation (Signed)
Anesthesia Post Note  Patient: Christina Henry  Procedure(s) Performed: Procedure(s) (LRB): OPEN REDUCTION INTERNAL (ORIF) FIXATION PATELLA (Right)  Patient location during evaluation: PACU Anesthesia Type: General and Regional Level of consciousness: sedated and patient cooperative Pain management: pain level controlled Vital Signs Assessment: post-procedure vital signs reviewed and stable Respiratory status: spontaneous breathing Cardiovascular status: stable Anesthetic complications: no    Last Vitals:  Filed Vitals:   03/06/16 0506 03/06/16 0507  BP:  134/67  Pulse:  86  Temp: 37.1 C   Resp:  19    Last Pain:  Filed Vitals:   03/06/16 0508  PainSc: 0-No pain                 Nolon Nations
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
## 2016-03-06 NOTE — Op Note (Signed)
Christina Henry, Christina Henry               ACCOUNT NO.:  [id number]  MEDICAL RECORD NO.:  [id number]  LOCATION:  5W07C                        FACILITY:  [facility]  PHYSICIAN:  Mark C. Lorin Mercy, M.D.    DATE OF BIRTH:  05/10/1941  DATE OF PROCEDURE:  03/05/2016 DATE OF DISCHARGE:                              OPERATIVE REPORT   PREOPERATIVE DIAGNOSIS:  Right displaced patella fracture.  POSTOPERATIVE DIAGNOSIS:  Right displaced patella fracture.  PROCEDURE:  Open reduction and internal fixation of right patella.  SURGEON:  Mark C. Lorin Mercy, M.D.  ANESTHESIA:  General plus preoperative femoral nerve block plus 20 mL Marcaine local.  COMPLICATIONS:  None.  IMPLANT:  A 45-mm cannulated screws partially threaded x2 with an 18- gauge figure-of-eight wire.  DESCRIPTION OF PROCEDURE:  After prepping with proximal thigh tourniquet, impervious stockinette, Coban, extremity sheets and drapes, sterile skin marker, a time-out procedure was completed.  Ancef was given prophylactically.  Midline incision was made.  Fracture was exposed, hematoma was evacuated.  Blood clots were taken out of the knee, irrigated, and continued to clean off until the ends of the patella which is basically a 2-piece fracture with a transverse fracture slightly more toward the inferior aspect then across the middle of the patella.  The fracture was reduced, held with towel clamps on the right and left side, and then K-wires were drilled up the cannulated screws, 4.0 partially threaded cancellous screws.  They were measured, checked under C-arm to make sure they were not entering the joint, and went to the midportion of the patella.  Two screws were placed.  Initial screw looked a little bit long and was backed out and switched over to shorter screw.  Figure-of-eight wire was passed through the head in a figure-of- eight fashion and then tightened down using a Cobb handle as a fulcrum following the tension band principle.   Figure-of-eight knot was tied. Wire was cut, tapped down, irrigated, and then checked under fluoroscopy.  Final spot pictures were taken.  Medial and lateral retinaculum was closed with #1 Ethibond sutures, 2-0 Vicryl in subcutaneous tissue, 3-0 Vicryl for subcuticular closure.  Tincture of benzoin, Steri-Strips, Marcaine infiltration, postop soft dressing and then re- application of knee immobilizer.  Instrument count and needle count was correct.    Mark C. Lorin Mercy, M.D.    MCY/MEDQ  D:  03/05/2016  T:  03/06/2016  Job:  [id number]
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
## 2016-03-06 NOTE — Progress Notes (Deleted)
x

## 2016-03-14 ENCOUNTER — Ambulatory Visit (INDEPENDENT_AMBULATORY_CARE_PROVIDER_SITE_OTHER): Payer: Medicare Other | Admitting: Family Medicine

## 2016-03-14 ENCOUNTER — Encounter: Payer: Self-pay | Admitting: Family Medicine

## 2016-03-14 DIAGNOSIS — M8580 Other specified disorders of bone density and structure, unspecified site: Secondary | ICD-10-CM | POA: Diagnosis not present

## 2016-03-14 DIAGNOSIS — S82001S Unspecified fracture of right patella, sequela: Secondary | ICD-10-CM | POA: Diagnosis not present

## 2016-03-14 DIAGNOSIS — D509 Iron deficiency anemia, unspecified: Secondary | ICD-10-CM | POA: Diagnosis not present

## 2016-03-14 DIAGNOSIS — R935 Abnormal findings on diagnostic imaging of other abdominal regions, including retroperitoneum: Secondary | ICD-10-CM

## 2016-03-14 MED ORDER — POLYSACCHARIDE IRON COMPLEX 150 MG PO CAPS: 150.0000 mg | ORAL_CAPSULE | Freq: Two times a day (BID) | ORAL | Status: DC

## 2016-03-14 NOTE — Patient Instructions (Signed)
I'm glad you are doing better Make sure you are drinking enough fluids  Prunes and miralax are ok  Take the iron once to twice daily for a month or two to get blood count up  Make sure to eat regular meals Eat something with protein first thing in the am
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
## 2016-03-14 NOTE — Progress Notes (Signed)
Pre visit review using our clinic review tool, if applicable. No additional management support is needed unless otherwise documented below in the visit note. 
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
## 2016-03-14 NOTE — Assessment & Plan Note (Signed)
Doing well s/p orif on 5/22 with Dr Lorin Mercy Non weight bearing Some isometric exercises  Pain control prn tramadol at night Continue ortho f/u

## 2016-03-14 NOTE — Assessment & Plan Note (Signed)
Hb went down to 9.9 after surgery Px Nu-iron 150 bid if tolerated Disc imp of regular meals and prunes/miralax to prev constipation  Will take 1-2 mo  Some fatigue -this may add to it
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
## 2016-03-14 NOTE — Assessment & Plan Note (Signed)
Was dx with b9 endometrial polyp

## 2016-03-15 ENCOUNTER — Encounter (HOSPITAL_COMMUNITY): Payer: Self-pay | Admitting: Orthopaedic Surgery

## 2016-06-15 ENCOUNTER — Ambulatory Visit (INDEPENDENT_AMBULATORY_CARE_PROVIDER_SITE_OTHER): Payer: Medicare Other

## 2016-06-15 DIAGNOSIS — Z23 Encounter for immunization: Secondary | ICD-10-CM

## 2016-07-17 ENCOUNTER — Ambulatory Visit (INDEPENDENT_AMBULATORY_CARE_PROVIDER_SITE_OTHER): Payer: Medicare Other | Admitting: Orthopaedic Surgery

## 2016-07-17 DIAGNOSIS — M4316 Spondylolisthesis, lumbar region: Secondary | ICD-10-CM | POA: Diagnosis not present

## 2016-07-17 DIAGNOSIS — M5416 Radiculopathy, lumbar region: Secondary | ICD-10-CM | POA: Diagnosis not present

## 2016-07-19 ENCOUNTER — Other Ambulatory Visit (INDEPENDENT_AMBULATORY_CARE_PROVIDER_SITE_OTHER): Payer: Self-pay | Admitting: Orthopaedic Surgery

## 2016-07-19 DIAGNOSIS — M545 Low back pain, unspecified: Secondary | ICD-10-CM

## 2016-07-19 DIAGNOSIS — M4310 Spondylolisthesis, site unspecified: Secondary | ICD-10-CM

## 2016-07-25 ENCOUNTER — Ambulatory Visit
Admission: RE | Admit: 2016-07-25 | Discharge: 2016-07-25 | Disposition: A | Discharge status: Home / Self Care | Payer: Medicare Other | Source: Ambulatory Visit | Attending: Orthopaedic Surgery | Admitting: Orthopaedic Surgery

## 2016-07-25 DIAGNOSIS — M545 Low back pain, unspecified: Secondary | ICD-10-CM

## 2016-07-25 DIAGNOSIS — M4310 Spondylolisthesis, site unspecified: Secondary | ICD-10-CM

## 2016-07-28 ENCOUNTER — Other Ambulatory Visit: Payer: Medicare Other

## 2016-08-21 ENCOUNTER — Encounter (INDEPENDENT_AMBULATORY_CARE_PROVIDER_SITE_OTHER): Payer: Self-pay | Admitting: Orthopaedic Surgery

## 2016-08-21 ENCOUNTER — Ambulatory Visit (INDEPENDENT_AMBULATORY_CARE_PROVIDER_SITE_OTHER): Payer: Medicare Other | Admitting: Orthopaedic Surgery

## 2016-08-21 VITALS — BP 132/69 | HR 72 | Ht 60.0 in | Wt 108.0 lb

## 2016-08-21 DIAGNOSIS — M5442 Lumbago with sciatica, left side: Secondary | ICD-10-CM | POA: Diagnosis not present

## 2016-08-21 DIAGNOSIS — G8929 Other chronic pain: Secondary | ICD-10-CM

## 2016-08-21 DIAGNOSIS — M25561 Pain in right knee: Secondary | ICD-10-CM

## 2016-08-21 DIAGNOSIS — M545 Low back pain: Secondary | ICD-10-CM | POA: Diagnosis not present
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
## 2016-10-30 ENCOUNTER — Telehealth: Payer: Self-pay | Admitting: *Deleted

## 2016-10-30 NOTE — Telephone Encounter (Signed)
Mammogram due in feb  dexa utd-does not need yet  She can schedule her own mammogram for screening

## 2016-10-30 NOTE — Telephone Encounter (Signed)
Pt notified of Dr. Marliss Coots comments and verbalized understanding. Pt will schedule mammogram

## 2016-10-30 NOTE — Telephone Encounter (Signed)
Left message with family requesting pt to call the office back 

## 2016-10-30 NOTE — Telephone Encounter (Signed)
Patient wants to know if she needs to have both a mammogram and bone density test done this year? Patient requested a call back to let her know what she needs to scheduled.

## 2016-11-11 ENCOUNTER — Telehealth: Payer: Self-pay | Admitting: Family Medicine

## 2016-11-11 DIAGNOSIS — E559 Vitamin D deficiency, unspecified: Secondary | ICD-10-CM

## 2016-11-11 DIAGNOSIS — D509 Iron deficiency anemia, unspecified: Secondary | ICD-10-CM

## 2016-11-11 DIAGNOSIS — Z0000 Encounter for general adult medical examination without abnormal findings: Secondary | ICD-10-CM

## 2016-11-11 IMAGING — CR DG FOOT 2V*R*
2 series · 2 of 2 positions shown · non-contrast
Comparison: None.

CLINICAL DATA: Post reduction of the right second digit.

EXAM:
RIGHT FOOT - 2 VIEW

[AP]
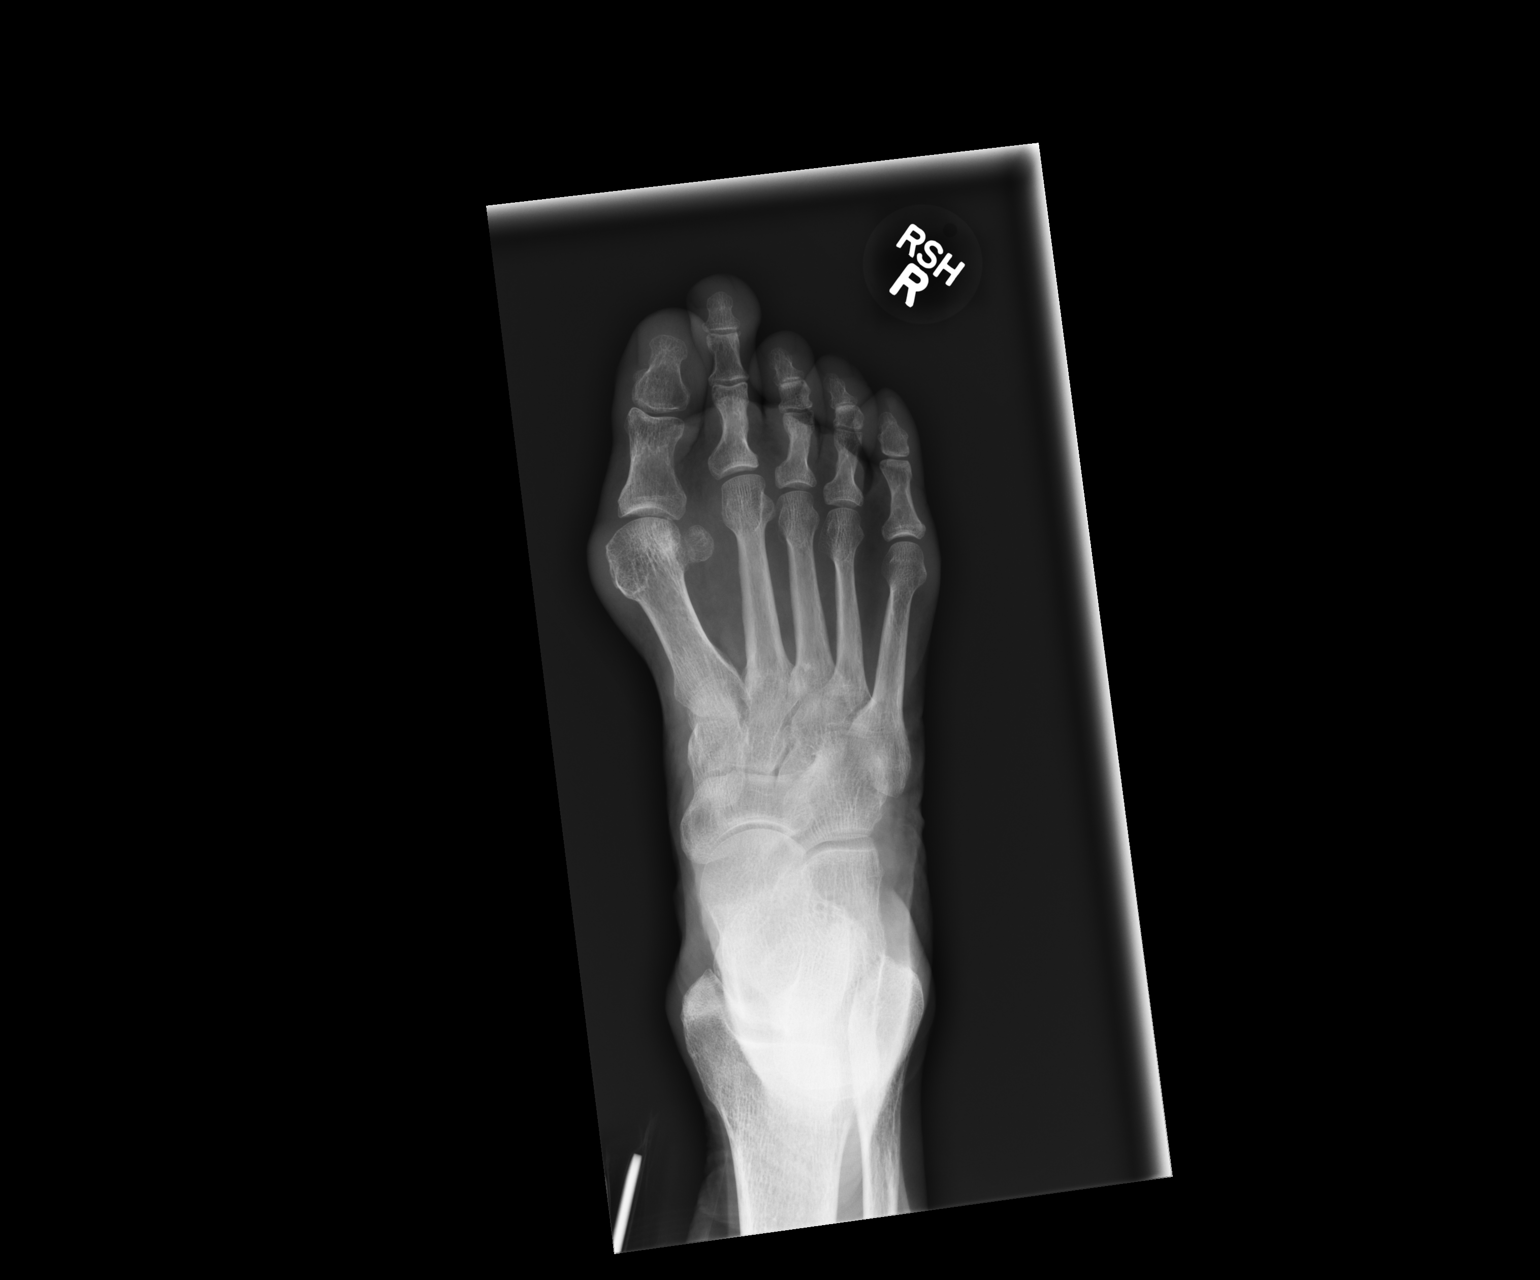

[lateral]
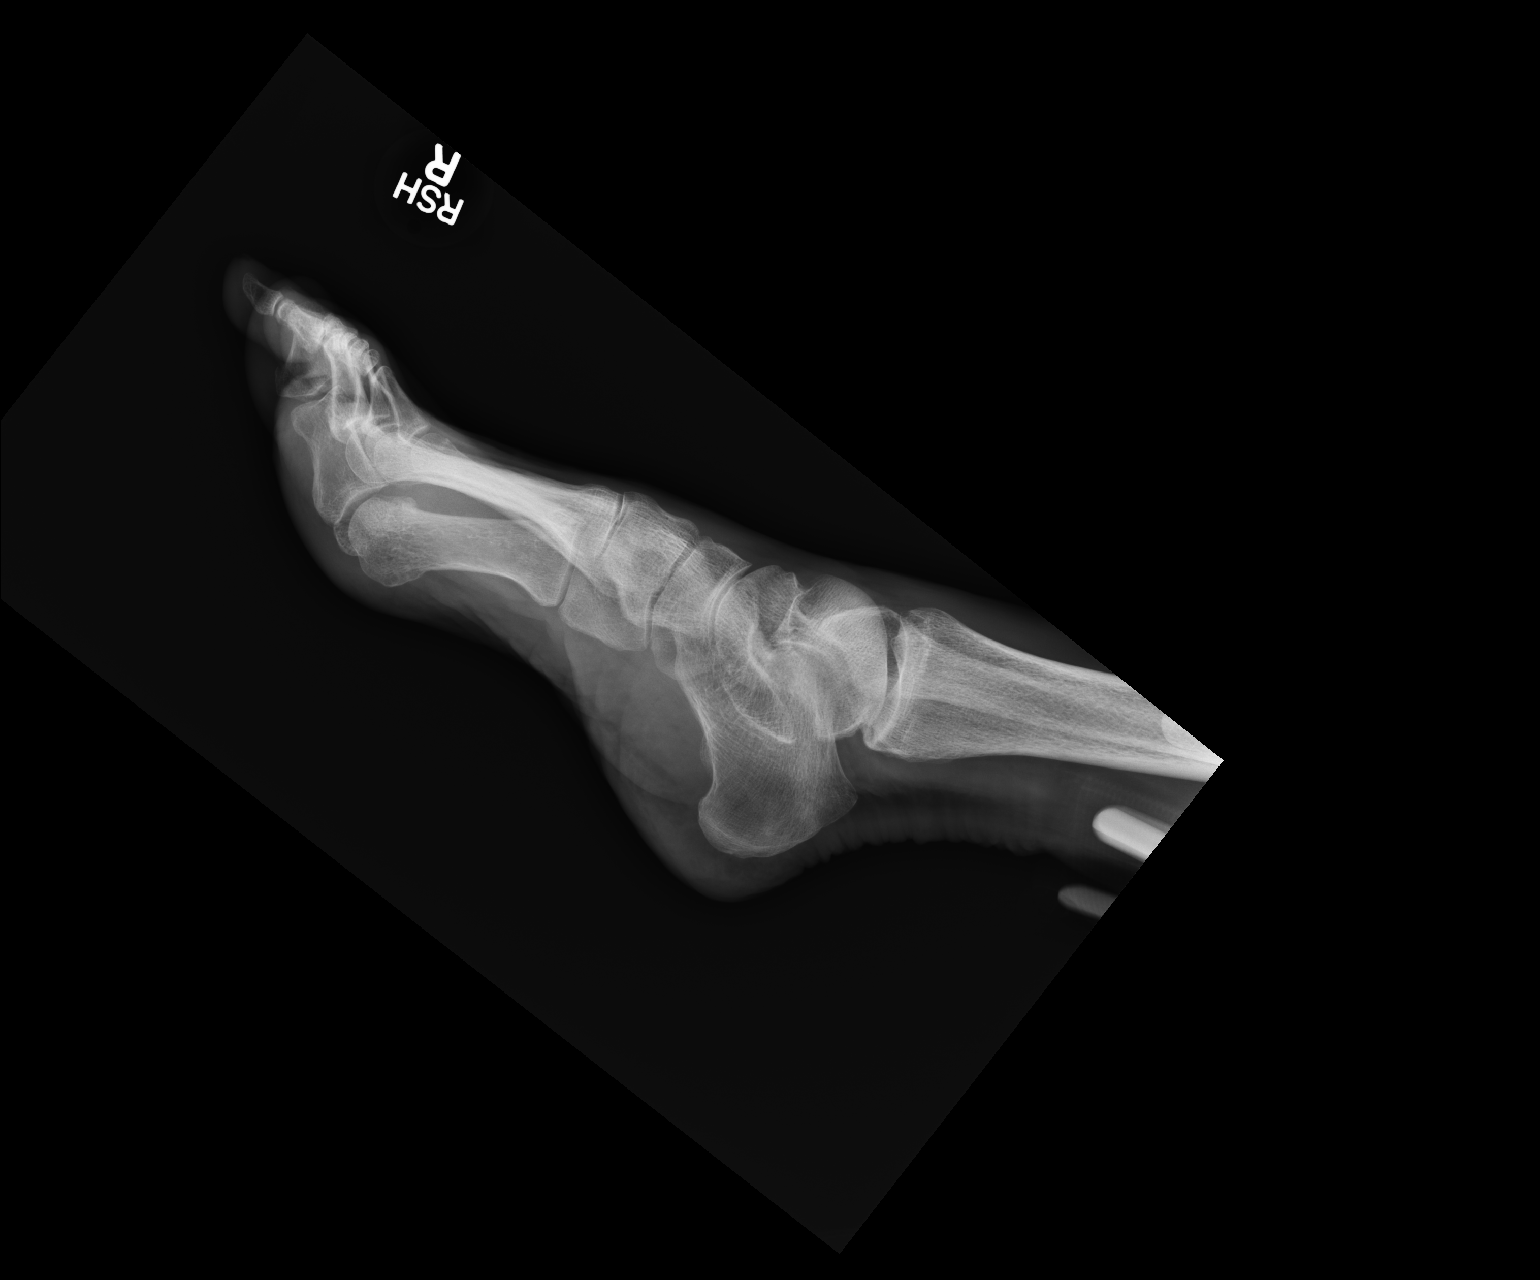

[2 of 2 positions shown; findings below may reference images not displayed]

FINDINGS: There is no evidence of fracture or dislocation. The previously
noted dislocation of the second metatarsal phalangeal joint is
reduced. Soft tissues are unremarkable.
IMPRESSION: Previously noted dislocation of the second metatarsal phalangeal
joint is reduced without fracture.

## 2016-11-11 NOTE — Telephone Encounter (Signed)
-----   Message from Ellamae Sia sent at 11/06/2016  2:48 PM EST ----- Regarding: Lab orders for Monday, 1.29.18 Patient is scheduled for CPX labs, please order future labs, Thanks , Karna Christmas

## 2016-11-12 ENCOUNTER — Other Ambulatory Visit (INDEPENDENT_AMBULATORY_CARE_PROVIDER_SITE_OTHER): Payer: Medicare HMO

## 2016-11-12 DIAGNOSIS — Z0000 Encounter for general adult medical examination without abnormal findings: Secondary | ICD-10-CM | POA: Diagnosis not present

## 2016-11-12 DIAGNOSIS — D509 Iron deficiency anemia, unspecified: Secondary | ICD-10-CM

## 2016-11-12 DIAGNOSIS — E559 Vitamin D deficiency, unspecified: Secondary | ICD-10-CM | POA: Diagnosis not present

## 2016-11-12 LAB — LIPID PANEL
CHOL/HDL RATIO: 3
Cholesterol: 215 mg/dL — ABNORMAL HIGH (ref 0–200)
HDL: 85.5 mg/dL (ref >39.00)
LDL CALC: 116 mg/dL — AB (ref 0–99)
NonHDL: 129.84
TRIGLYCERIDES: 70 mg/dL (ref 0.0–149.0)
VLDL: 14 mg/dL (ref 0.0–40.0)

## 2016-11-12 LAB — CBC WITH DIFFERENTIAL/PLATELET
BASOS PCT: 1 % (ref 0.0–3.0)
Basophils Absolute: 0.1 10*3/uL (ref 0.0–0.1)
Eosinophils Absolute: 0.7 10*3/uL (ref 0.0–0.7)
Eosinophils Relative: 9 % — ABNORMAL HIGH (ref 0.0–5.0)
HEMATOCRIT: 38.2 % (ref 36.0–46.0)
Hemoglobin: 13.2 g/dL (ref 12.0–15.0)
LYMPHS ABS: 2.1 10*3/uL (ref 0.7–4.0)
LYMPHS PCT: 29.2 % (ref 12.0–46.0)
MCHC: 34.7 g/dL (ref 30.0–36.0)
MCV: 88.6 fl (ref 78.0–100.0)
MONOS PCT: 10 % (ref 3.0–12.0)
Monocytes Absolute: 0.7 10*3/uL (ref 0.1–1.0)
NEUTROS ABS: 3.7 10*3/uL (ref 1.4–7.7)
NEUTROS PCT: 50.8 % (ref 43.0–77.0)
PLATELETS: 382 10*3/uL (ref 150.0–400.0)
RBC: 4.31 Mil/uL (ref 3.87–5.11)
RDW: 12.2 % (ref 11.5–15.5)
WBC: 7.3 10*3/uL (ref 4.0–10.5)

## 2016-11-12 LAB — COMPREHENSIVE METABOLIC PANEL
ALT: 15 U/L (ref 0–35)
AST: 20 U/L (ref 0–37)
Albumin: 4.5 g/dL (ref 3.5–5.2)
Alkaline Phosphatase: 60 U/L (ref 39–117)
BUN: 18 mg/dL (ref 6–23)
CHLORIDE: 96 meq/L (ref 96–112)
CO2: 29 meq/L (ref 19–32)
Calcium: 9.9 mg/dL (ref 8.4–10.5)
Creatinine, Ser: 0.88 mg/dL (ref 0.40–1.20)
GFR: 66.55 mL/min (ref >60.00)
Glucose, Bld: 95 mg/dL (ref 70–99)
Potassium: 4.7 mEq/L (ref 3.5–5.1)
Sodium: 132 mEq/L — ABNORMAL LOW (ref 135–145)
Total Bilirubin: 0.6 mg/dL (ref 0.2–1.2)
Total Protein: 7 g/dL (ref 6.0–8.3)

## 2016-11-12 LAB — FERRITIN: Ferritin: 134.5 ng/mL (ref 10.0–291.0)

## 2016-11-12 LAB — TSH: TSH: 2.24 u[IU]/mL (ref 0.35–4.50)

## 2016-11-12 LAB — VITAMIN D 25 HYDROXY (VIT D DEFICIENCY, FRACTURES): VITD: 37.26 ng/mL (ref 30.00–100.00)

## 2016-11-16 ENCOUNTER — Ambulatory Visit (INDEPENDENT_AMBULATORY_CARE_PROVIDER_SITE_OTHER): Payer: Medicare HMO | Admitting: Family Medicine

## 2016-11-16 ENCOUNTER — Encounter: Payer: Self-pay | Admitting: Family Medicine

## 2016-11-16 VITALS — BP 120/74 | HR 63 | Temp 98.1°F | Ht 59.0 in | Wt 111.2 lb

## 2016-11-16 DIAGNOSIS — Z0000 Encounter for general adult medical examination without abnormal findings: Secondary | ICD-10-CM

## 2016-11-16 DIAGNOSIS — E7800 Pure hypercholesterolemia, unspecified: Secondary | ICD-10-CM

## 2016-11-16 DIAGNOSIS — N8500 Endometrial hyperplasia, unspecified: Secondary | ICD-10-CM

## 2016-11-16 DIAGNOSIS — E559 Vitamin D deficiency, unspecified: Secondary | ICD-10-CM | POA: Diagnosis not present

## 2016-11-16 DIAGNOSIS — M8589 Other specified disorders of bone density and structure, multiple sites: Secondary | ICD-10-CM
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
## 2016-11-16 NOTE — Patient Instructions (Addendum)
Get your mammogram as planned  Also your colonoscopy   For cholesterol   Avoid red meat/ fried foods/ egg yolks/ fatty breakfast meats/ butter, cheese and high fat dairy/ and shellfish  (cut back on the red meat if you can)   Stop at check out to schedule AMW with Katha Cabal

## 2016-11-16 NOTE — Progress Notes (Signed)
Pre visit review using our clinic review tool, if applicable. No additional management support is needed unless otherwise documented below in the visit note. 

## 2016-11-18 NOTE — Assessment & Plan Note (Signed)
S/p full course of fosamax  Doing well w/o falls or fragility fractures Disc need for calcium/ vitamin D/ wt bearing exercise and bone density test every 2 y to monitor Disc safety/ fracture risk in detail   Due for dexa in a year

## 2016-11-18 NOTE — Assessment & Plan Note (Signed)
Level 37.2 Vitamin D level is therapeutic with current supplementation Disc importance of this to bone and overall health

## 2016-11-18 NOTE — Assessment & Plan Note (Signed)
Reviewed health habits including diet and exercise and skin cancer prevention Reviewed appropriate screening tests for age  Also reviewed health mt list, fam hx and immunization status , as well as social and family history   Will schedule AMW visit with [employer] reviewed See HPI  She will get her mammogram and colonoscopy as planned  utd gyn care and has a f/u  dexa due in another year  imms up to date  Past hx of renal cancer doing well with f/u soon  Rev cholestero and diet

## 2016-11-18 NOTE — Assessment & Plan Note (Signed)
Very good HDL and LDL is up a bit Disc goals for lipids and reasons to control them Rev labs with pt Rev low sat fat diet in detail

## 2016-11-18 NOTE — Assessment & Plan Note (Signed)
Sees gyn and had D and C with resolution

## 2016-11-22 ENCOUNTER — Ambulatory Visit (INDEPENDENT_AMBULATORY_CARE_PROVIDER_SITE_OTHER): Payer: Medicare HMO

## 2016-11-22 VITALS — BP 132/76 | HR 70 | Temp 98.0°F | Ht 59.0 in | Wt 111.8 lb

## 2016-11-22 DIAGNOSIS — Z0000 Encounter for general adult medical examination without abnormal findings: Secondary | ICD-10-CM | POA: Diagnosis not present

## 2016-11-22 NOTE — Progress Notes (Signed)
Pre visit review using our clinic review tool, if applicable. No additional management support is needed unless otherwise documented below in the visit note. 

## 2016-11-22 NOTE — Patient Instructions (Signed)
[name] , Thank you for taking time to come for your Medicare Wellness Visit. I appreciate your ongoing commitment to your health goals. Please review the following plan we discussed and let me know if I can assist you in the future.   These are the goals we discussed: Goals    . Increase physical activity          Starting 11/22/2016, I will continue to exercise at least 30 min 2-3 days per week.        This is a list of the screening recommended for you and due dates:  Health Maintenance  Topic Date Due  . Colon Cancer Screening  04/24/2021*  . Mammogram  11/24/2016  . Tetanus Vaccine  11/10/2022  . Flu Shot  Completed  . DEXA scan (bone density measurement)  Completed  . Shingles Vaccine  Completed  . Pneumonia vaccines  Completed  *Topic was postponed. The date shown is not the original due date.   Preventive Care for Adults  A healthy lifestyle and preventive care can promote health and wellness. Preventive health guidelines for adults include the following key practices.  . A routine yearly physical is a good way to check with your health care provider about your health and preventive screening. It is a chance to share any concerns and updates on your health and to receive a thorough exam.  . Visit your dentist for a routine exam and preventive care every 6 months. Brush your teeth twice a day and floss once a day. Good oral hygiene prevents tooth decay and gum disease.  . The frequency of eye exams is based on your age, health, family medical history, use  of contact lenses, and other factors. Follow your health care provider's ecommendations for frequency of eye exams.  . Eat a healthy diet. Foods like vegetables, fruits, whole grains, low-fat dairy products, and lean protein foods contain the nutrients you need without too many calories. Decrease your intake of foods high in solid fats, added sugars, and salt. Eat the right amount of calories for you. Get information  about a proper diet from your health care provider, if necessary.  . Regular physical exercise is one of the most important things you can do for your health. Most adults should get at least 150 minutes of moderate-intensity exercise (any activity that increases your heart rate and causes you to sweat) each week. In addition, most adults need muscle-strengthening exercises on 2 or more days a week.  Silver Sneakers may be a benefit available to you. To determine eligibility, you may visit the website: www.silversneakers.com or contact program at 217-225-4315 Mon-Fri between 8AM-8PM.   . Maintain a healthy weight. The body mass index (BMI) is a screening tool to identify possible weight problems. It provides an estimate of body fat based on height and weight. Your health care provider can find your BMI and can help you achieve or maintain a healthy weight.   For adults 20 years and older: ? A BMI below 18.5 is considered underweight. ? A BMI of 18.5 to 24.9 is normal. ? A BMI of 25 to 29.9 is considered overweight. ? A BMI of 30 and above is considered obese.   . Maintain normal blood lipids and cholesterol levels by exercising and minimizing your intake of saturated fat. Eat a balanced diet with plenty of fruit and vegetables. Blood tests for lipids and cholesterol should begin at age 86 and be repeated every 5 years. If your lipid  or cholesterol levels are high, you are over 50, or you are at high risk for heart disease, you may need your cholesterol levels checked more frequently. Ongoing high lipid and cholesterol levels should be treated with medicines if diet and exercise are not working.  . If you smoke, find out from your health care provider how to quit. If you do not use tobacco, please do not start.  . If you choose to drink alcohol, please do not consume more than 2 drinks per day. One drink is considered to be 12 ounces (355 mL) of beer, 5 ounces (148 mL) of wine, or 1.5 ounces (44  mL) of liquor.  . If you are 75-23 years old, ask your health care provider if you should take aspirin to prevent strokes.  . Use sunscreen. Apply sunscreen liberally and repeatedly throughout the day. You should seek shade when your shadow is shorter than you. Protect yourself by wearing long sleeves, pants, a wide-brimmed hat, and sunglasses year round, whenever you are outdoors.  . Once a month, do a whole body skin exam, using a mirror to look at the skin on your back. Tell your health care provider of new moles, moles that have irregular borders, moles that are larger than a pencil eraser, or moles that have changed in shape or color.

## 2016-11-22 NOTE — Progress Notes (Signed)
PCP notes:   Health maintenance:  Colonoscopy - per pt, future appt scheduled Mammogram - per pt, future appt scheduled  Abnormal screenings:   None  Patient concerns:   None  Nurse concerns:  None  Next PCP appt:   N/A; CPE on 11/16/16
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
## 2016-11-22 NOTE — Progress Notes (Signed)
Medical screening examination/treatment/procedure(s) were performed by registered nurse and as supervising non-physician practitioner, I was immediately available for consultation/collaboration.   Mohid Furuya, NP  

## 2016-11-26 DIAGNOSIS — Z08 Encounter for follow-up examination after completed treatment for malignant neoplasm: Secondary | ICD-10-CM | POA: Diagnosis not present

## 2016-11-26 DIAGNOSIS — Z905 Acquired absence of kidney: Secondary | ICD-10-CM | POA: Diagnosis not present

## 2016-11-26 DIAGNOSIS — C641 Malignant neoplasm of right kidney, except renal pelvis: Secondary | ICD-10-CM | POA: Diagnosis not present

## 2016-11-26 DIAGNOSIS — C7900 Secondary malignant neoplasm of unspecified kidney and renal pelvis: Secondary | ICD-10-CM | POA: Diagnosis not present

## 2016-11-26 DIAGNOSIS — Z85528 Personal history of other malignant neoplasm of kidney: Secondary | ICD-10-CM | POA: Diagnosis not present

## 2016-11-26 DIAGNOSIS — C649 Malignant neoplasm of unspecified kidney, except renal pelvis: Secondary | ICD-10-CM | POA: Diagnosis not present

## 2016-11-27 ENCOUNTER — Encounter: Payer: Self-pay | Admitting: *Deleted

## 2016-11-28 ENCOUNTER — Encounter: Payer: Self-pay | Admitting: *Deleted

## 2016-11-28 ENCOUNTER — Ambulatory Visit: Payer: Medicare HMO | Admitting: Anesthesiology

## 2016-11-28 ENCOUNTER — Encounter
Admission: RE | Disposition: A | Discharge status: Home / Self Care | Payer: Self-pay | Source: Ambulatory Visit | Attending: Unknown Physician Specialty

## 2016-11-28 ENCOUNTER — Ambulatory Visit
Admission: RE | Admit: 2016-11-28 | Discharge: 2016-11-28 | Disposition: A | Discharge status: Home / Self Care | Payer: Medicare HMO | Source: Ambulatory Visit | Attending: Unknown Physician Specialty | Admitting: Unknown Physician Specialty

## 2016-11-28 DIAGNOSIS — Z1211 Encounter for screening for malignant neoplasm of colon: Secondary | ICD-10-CM | POA: Diagnosis not present

## 2016-11-28 DIAGNOSIS — M8580 Other specified disorders of bone density and structure, unspecified site: Secondary | ICD-10-CM | POA: Insufficient documentation

## 2016-11-28 DIAGNOSIS — Z85528 Personal history of other malignant neoplasm of kidney: Secondary | ICD-10-CM | POA: Insufficient documentation

## 2016-11-28 DIAGNOSIS — Z888 Allergy status to other drugs, medicaments and biological substances status: Secondary | ICD-10-CM | POA: Insufficient documentation

## 2016-11-28 DIAGNOSIS — Z86010 Personal history of colonic polyps: Secondary | ICD-10-CM | POA: Diagnosis not present

## 2016-11-28 DIAGNOSIS — M1990 Unspecified osteoarthritis, unspecified site: Secondary | ICD-10-CM | POA: Diagnosis not present

## 2016-11-28 DIAGNOSIS — Z79899 Other long term (current) drug therapy: Secondary | ICD-10-CM | POA: Diagnosis not present

## 2016-11-28 DIAGNOSIS — K5790 Diverticulosis of intestine, part unspecified, without perforation or abscess without bleeding: Secondary | ICD-10-CM | POA: Diagnosis not present

## 2016-11-28 DIAGNOSIS — K5730 Diverticulosis of large intestine without perforation or abscess without bleeding: Secondary | ICD-10-CM | POA: Diagnosis not present

## 2016-11-28 DIAGNOSIS — D649 Anemia, unspecified: Secondary | ICD-10-CM | POA: Insufficient documentation

## 2016-11-28 DIAGNOSIS — M5432 Sciatica, left side: Secondary | ICD-10-CM | POA: Diagnosis not present

## 2016-11-28 DIAGNOSIS — Z905 Acquired absence of kidney: Secondary | ICD-10-CM | POA: Insufficient documentation

## 2016-11-28 DIAGNOSIS — K648 Other hemorrhoids: Secondary | ICD-10-CM | POA: Diagnosis not present

## 2016-11-28 DIAGNOSIS — Z8619 Personal history of other infectious and parasitic diseases: Secondary | ICD-10-CM | POA: Insufficient documentation

## 2016-11-28 DIAGNOSIS — K640 First degree hemorrhoids: Secondary | ICD-10-CM | POA: Insufficient documentation

## 2016-11-28 DIAGNOSIS — Z885 Allergy status to narcotic agent status: Secondary | ICD-10-CM | POA: Diagnosis not present

## 2016-11-28 HISTORY — DX: Personal history of other infectious and parasitic diseases: Z86.19

## 2016-11-28 HISTORY — DX: Anemia, unspecified: D64.9

## 2016-11-28 HISTORY — DX: Personal history of colonic polyps: Z86.010

## 2016-11-28 HISTORY — PX: COLONOSCOPY WITH PROPOFOL: SHX5780

## 2016-11-28 HISTORY — DX: Personal history of colon polyps, unspecified: Z86.0100

## 2016-11-28 SURGERY — COLONOSCOPY WITH PROPOFOL
Anesthesia: General

## 2016-11-28 MED ORDER — SODIUM CHLORIDE 0.9 % IV SOLN
INTRAVENOUS | Status: DC
  Administered 2016-11-28: 1000 mL via INTRAVENOUS

## 2016-11-28 MED ORDER — SODIUM CHLORIDE 0.9 % IV SOLN: INTRAVENOUS | Status: DC

## 2016-11-28 MED ORDER — PROPOFOL 500 MG/50ML IV EMUL
INTRAVENOUS | Status: AC
Start: 2016-11-28 — End: 2016-11-28
  Filled 2016-11-28: qty 50

## 2016-11-28 MED ORDER — PROPOFOL 10 MG/ML IV BOLUS
INTRAVENOUS | Status: DC | PRN
  Administered 2016-11-28: 30 mg via INTRAVENOUS
  Administered 2016-11-28: 50 mg via INTRAVENOUS

## 2016-11-28 MED ORDER — PROPOFOL 500 MG/50ML IV EMUL
INTRAVENOUS | Status: DC | PRN
  Administered 2016-11-28: 140 ug/kg/min via INTRAVENOUS

## 2016-11-28 NOTE — Anesthesia Post-op Follow-up Note (Signed)
Anesthesia QCDR form completed.        
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
## 2016-11-28 NOTE — Transfer of Care (Signed)
Immediate Anesthesia Transfer of Care Note  Patient: Christina Henry  Procedure(s) Performed: Procedure(s): COLONOSCOPY WITH PROPOFOL (N/A)  Patient Location: PACU  Anesthesia Type:General  Level of Consciousness: awake  Airway & Oxygen Therapy: Patient Spontanous Breathing and Patient connected to nasal cannula oxygen  Post-op Assessment: Report given to RN and Post -op Vital signs reviewed and stable  Post vital signs: Reviewed and stable  Last Vitals:  Vitals:   11/28/16 0942  BP: 137/81  Pulse: 70  Resp: 20  Temp: 36.5 C    Last Pain:  Vitals:   11/28/16 0942  TempSrc: Tympanic         Complications: No apparent anesthesia complications
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
## 2016-11-28 NOTE — Op Note (Signed)
[facility] Patient Name: Christina Henry Procedure Date: 11/28/2016 9:59 AM MRN: [id number] Account #: [id number] Date of Birth: [birth year] Admit Type: Outpatient Age: 76 Room: [facility] ENDO ROOM 4 Gender: Female Note Status: Finalized Procedure:            Colonoscopy Indications:          High risk colon cancer surveillance: Personal history                        of colonic polyps Providers:            Manya Silvas, MD Referring MD:         Wynelle Fanny. [place] (Referring MD) Medicines:            Propofol per Anesthesia Complications:        No immediate complications. Procedure:            Pre-Anesthesia Assessment:                       - After reviewing the risks and benefits, the patient                        was deemed in satisfactory condition to undergo the                        procedure.                       After obtaining informed consent, the colonoscope was                        passed under direct vision. Throughout the procedure,                        the patient's blood pressure, pulse, and oxygen                        saturations were monitored continuously. The                        Colonoscope was introduced through the anus and                        advanced to the the cecum, identified by appendiceal                        orifice and ileocecal valve. The colonoscopy was                        performed with moderate difficulty due to a tortuous                        colon. Successful completion of the procedure was aided                        by applying abdominal pressure. The patient tolerated                        the procedure well. The quality of the bowel  preparation was excellent. Findings:      Many small-mouthed diverticula were found in the sigmoid colon and       descending colon.      Internal hemorrhoids were found during endoscopy. The hemorrhoids were       small and Grade I  (internal hemorrhoids that do not prolapse).      The exam was otherwise without abnormality. Impression:           - Diverticulosis in the sigmoid colon and in the                        descending colon.                       - Internal hemorrhoids.                       - The examination was otherwise normal.                       - No specimens collected. Recommendation:       - The findings and recommendations were discussed with                        the patient's family. Since she will be 76 years old in                        5 years I do not recommend a routine repeat colonoscopy. Manya Silvas, MD 11/28/2016 10:37:26 AM This report has been signed electronically. Number of Addenda: 0 Note Initiated On: 11/28/2016 9:59 AM Scope Withdrawal Time: 0 hours 7 minutes 52 seconds  Total Procedure Duration: 0 hours 24 minutes 22 seconds       [facility]

## 2016-11-28 NOTE — Anesthesia Postprocedure Evaluation (Signed)
Anesthesia Post Note  Patient: MALISSIA HAGEMEISTER  Procedure(s) Performed: Procedure(s) (LRB): COLONOSCOPY WITH PROPOFOL (N/A)  Patient location during evaluation: Endoscopy Anesthesia Type: General Level of consciousness: awake and alert Pain management: pain level controlled Vital Signs Assessment: post-procedure vital signs reviewed and stable Respiratory status: spontaneous breathing, nonlabored ventilation, respiratory function stable and patient connected to nasal cannula oxygen Cardiovascular status: blood pressure returned to baseline and stable Postop Assessment: no signs of nausea or vomiting Anesthetic complications: no     Last Vitals:  Vitals:   11/28/16 1047 11/28/16 1057  BP: 126/79 139/72  Pulse: (!) 57 64  Resp: 16 15  Temp:      Last Pain:  Vitals:   11/28/16 1037  TempSrc: Tympanic                 Precious Haws Marializ Ferrebee

## 2016-11-29 ENCOUNTER — Encounter: Payer: Self-pay | Admitting: Unknown Physician Specialty

## 2016-12-03 DIAGNOSIS — Z1231 Encounter for screening mammogram for malignant neoplasm of breast: Secondary | ICD-10-CM | POA: Diagnosis not present

## 2016-12-05 ENCOUNTER — Encounter: Payer: Self-pay | Admitting: Family Medicine

## 2016-12-11 DIAGNOSIS — R69 Illness, unspecified: Secondary | ICD-10-CM | POA: Diagnosis not present

## 2016-12-27 DIAGNOSIS — Z01419 Encounter for gynecological examination (general) (routine) without abnormal findings: Secondary | ICD-10-CM | POA: Diagnosis not present

## 2016-12-27 DIAGNOSIS — Z6822 Body mass index (BMI) 22.0-22.9, adult: Secondary | ICD-10-CM | POA: Diagnosis not present

## 2017-02-05 DIAGNOSIS — L570 Actinic keratosis: Secondary | ICD-10-CM | POA: Diagnosis not present

## 2017-02-05 DIAGNOSIS — Z85828 Personal history of other malignant neoplasm of skin: Secondary | ICD-10-CM | POA: Diagnosis not present

## 2017-02-05 DIAGNOSIS — L820 Inflamed seborrheic keratosis: Secondary | ICD-10-CM | POA: Diagnosis not present
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
## 2017-06-12 DIAGNOSIS — R69 Illness, unspecified: Secondary | ICD-10-CM | POA: Diagnosis not present

## 2017-06-23 ENCOUNTER — Encounter: Payer: Self-pay | Admitting: Family Medicine

## 2017-06-26 ENCOUNTER — Encounter: Payer: Self-pay | Admitting: Family Medicine

## 2017-06-26 ENCOUNTER — Ambulatory Visit (INDEPENDENT_AMBULATORY_CARE_PROVIDER_SITE_OTHER): Payer: Medicare HMO | Admitting: Family Medicine

## 2017-06-26 VITALS — BP 118/62 | HR 64 | Temp 97.8°F | Ht 59.0 in | Wt 111.8 lb

## 2017-06-26 DIAGNOSIS — D509 Iron deficiency anemia, unspecified: Secondary | ICD-10-CM | POA: Diagnosis not present

## 2017-06-26 DIAGNOSIS — G8929 Other chronic pain: Secondary | ICD-10-CM

## 2017-06-26 DIAGNOSIS — Z23 Encounter for immunization: Secondary | ICD-10-CM | POA: Diagnosis not present

## 2017-06-26 DIAGNOSIS — M25511 Pain in right shoulder: Secondary | ICD-10-CM | POA: Insufficient documentation

## 2017-06-26 DIAGNOSIS — R5382 Chronic fatigue, unspecified: Secondary | ICD-10-CM

## 2017-06-26 DIAGNOSIS — R5383 Other fatigue: Secondary | ICD-10-CM | POA: Insufficient documentation

## 2017-06-26 LAB — CBC WITH DIFFERENTIAL/PLATELET
BASOS PCT: 0.7 % (ref 0.0–3.0)
Basophils Absolute: 0 10*3/uL (ref 0.0–0.1)
EOS ABS: 0.2 10*3/uL (ref 0.0–0.7)
EOS PCT: 2.9 % (ref 0.0–5.0)
HCT: 39.3 % (ref 36.0–46.0)
Hemoglobin: 13.3 g/dL (ref 12.0–15.0)
Lymphocytes Relative: 32.8 % (ref 12.0–46.0)
Lymphs Abs: 2.3 10*3/uL (ref 0.7–4.0)
MCHC: 33.7 g/dL (ref 30.0–36.0)
MCV: 90.4 fl (ref 78.0–100.0)
MONO ABS: 0.7 10*3/uL (ref 0.1–1.0)
Monocytes Relative: 10.4 % (ref 3.0–12.0)
NEUTROS ABS: 3.8 10*3/uL (ref 1.4–7.7)
Neutrophils Relative %: 53.2 % (ref 43.0–77.0)
PLATELETS: 327 10*3/uL (ref 150.0–400.0)
RBC: 4.35 Mil/uL (ref 3.87–5.11)
RDW: 12.9 % (ref 11.5–15.5)
WBC: 7.1 10*3/uL (ref 4.0–10.5)

## 2017-06-26 LAB — COMPREHENSIVE METABOLIC PANEL
ALT: 15 U/L (ref 0–35)
AST: 19 U/L (ref 0–37)
Albumin: 4.4 g/dL (ref 3.5–5.2)
Alkaline Phosphatase: 53 U/L (ref 39–117)
BUN: 23 mg/dL (ref 6–23)
CALCIUM: 10 mg/dL (ref 8.4–10.5)
CHLORIDE: 98 meq/L (ref 96–112)
CO2: 28 mEq/L (ref 19–32)
CREATININE: 1.13 mg/dL (ref 0.40–1.20)
GFR: 49.78 mL/min — ABNORMAL LOW (ref >60.00)
Glucose, Bld: 100 mg/dL — ABNORMAL HIGH (ref 70–99)
POTASSIUM: 4.6 meq/L (ref 3.5–5.1)
SODIUM: 135 meq/L (ref 135–145)
Total Bilirubin: 0.7 mg/dL (ref 0.2–1.2)
Total Protein: 6.7 g/dL (ref 6.0–8.3)

## 2017-06-26 LAB — TSH: TSH: 1.95 u[IU]/mL (ref 0.35–4.50)

## 2017-06-26 LAB — FERRITIN: FERRITIN: 117.3 ng/mL (ref 10.0–291.0)

## 2017-06-26 NOTE — Assessment & Plan Note (Signed)
This may be multifactorial  Suspect some degree of exhaustion/? Heat illness/relative dehydration from many days working out in the heat Stressed imp of fluids- at least 64 oz per day or more for catch up   (water/diluted gatorade)  Also some additional rest in cool temps Hx of anemia s/p surgery -imp with iron - will re check this and make sure it has not returned-cbc/ferritin  Also cmet/tsh  Update if not starting to improve in a week or if worsening
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
## 2017-06-26 NOTE — Assessment & Plan Note (Signed)
Pt had iron def after her D and C in 2017 This resolved after tx with oral iron  Diet is good  Now fatigued again (suspect multi factorial)  Check cbc with ferritin

## 2017-06-26 NOTE — Assessment & Plan Note (Signed)
Chronic with recent flare in R handed female who does much physical work  Suspect bicep tendonitis  Recommend ice/relative rest  nsaid (aleve) if needed with food  Avoid heavy lifting /pulling  Is interested in rehab if needed Ref to sport med for formal eval

## 2017-06-26 NOTE — Patient Instructions (Addendum)
Try to drink 64 oz of fluids per day (mostly water) - gatorade helps too  Labs today for iron and other causes of fatigue   Use ice on your shoulder/arm whenever you can for 10 minutes  An aleve occasionally with food is ok  We will make you a sport med appt    Stay in for a few days - don't overheat  Really push the fluids

## 2017-07-03 DIAGNOSIS — L814 Other melanin hyperpigmentation: Secondary | ICD-10-CM | POA: Diagnosis not present

## 2017-07-03 DIAGNOSIS — L570 Actinic keratosis: Secondary | ICD-10-CM | POA: Diagnosis not present

## 2017-07-03 DIAGNOSIS — L820 Inflamed seborrheic keratosis: Secondary | ICD-10-CM | POA: Diagnosis not present

## 2017-07-03 DIAGNOSIS — L738 Other specified follicular disorders: Secondary | ICD-10-CM | POA: Diagnosis not present

## 2017-07-03 DIAGNOSIS — L110 Acquired keratosis follicularis: Secondary | ICD-10-CM | POA: Diagnosis not present

## 2017-07-03 DIAGNOSIS — Z85828 Personal history of other malignant neoplasm of skin: Secondary | ICD-10-CM | POA: Diagnosis not present

## 2017-07-11 ENCOUNTER — Encounter: Payer: Self-pay | Admitting: Family Medicine

## 2017-07-11 ENCOUNTER — Ambulatory Visit (INDEPENDENT_AMBULATORY_CARE_PROVIDER_SITE_OTHER): Payer: Medicare HMO | Admitting: Family Medicine

## 2017-07-11 VITALS — BP 136/74 | HR 61 | Temp 97.8°F | Ht 59.0 in | Wt 113.0 lb

## 2017-07-11 DIAGNOSIS — M7541 Impingement syndrome of right shoulder: Secondary | ICD-10-CM

## 2017-07-11 DIAGNOSIS — M7581 Other shoulder lesions, right shoulder: Secondary | ICD-10-CM | POA: Diagnosis not present
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
## 2017-07-11 NOTE — Patient Instructions (Signed)
Sleeper stretch 5-10 sec x 3 each side twice a day  Pinch shoulder blades together 3 times a day  REFERRALS TO SPECIALISTS, SPECIAL TESTS (MRI, CT, ULTRASOUNDS)  Christina Henry or Christina Henry will help you. ASK CHECK-IN FOR HELP.  Imaging / Special Testing referrals sometimes can be done same day if EMERGENCY, but others can take 2 or 3 days to get an appointment. Starting in 2015, many of the new Medicare plans and Obamacare plans take much longer.   Specialist appointment times vary a great deal, based on their schedule / openings. -- Some specialists have very long wait times. (Example. Dermatology. Multiple months  for non-cancer)

## 2017-07-15 DIAGNOSIS — M25511 Pain in right shoulder: Secondary | ICD-10-CM | POA: Diagnosis not present

## 2017-07-15 DIAGNOSIS — M7541 Impingement syndrome of right shoulder: Secondary | ICD-10-CM | POA: Diagnosis not present

## 2017-07-22 DIAGNOSIS — M7541 Impingement syndrome of right shoulder: Secondary | ICD-10-CM | POA: Diagnosis not present

## 2017-07-22 DIAGNOSIS — M25511 Pain in right shoulder: Secondary | ICD-10-CM | POA: Diagnosis not present

## 2017-07-30 DIAGNOSIS — M7541 Impingement syndrome of right shoulder: Secondary | ICD-10-CM | POA: Diagnosis not present

## 2017-07-30 DIAGNOSIS — M25511 Pain in right shoulder: Secondary | ICD-10-CM | POA: Diagnosis not present

## 2017-08-06 DIAGNOSIS — M25511 Pain in right shoulder: Secondary | ICD-10-CM | POA: Diagnosis not present

## 2017-08-06 DIAGNOSIS — M7541 Impingement syndrome of right shoulder: Secondary | ICD-10-CM | POA: Diagnosis not present

## 2017-08-20 DIAGNOSIS — M7541 Impingement syndrome of right shoulder: Secondary | ICD-10-CM | POA: Diagnosis not present

## 2017-08-20 DIAGNOSIS — M25511 Pain in right shoulder: Secondary | ICD-10-CM | POA: Diagnosis not present

## 2017-11-14 DIAGNOSIS — C641 Malignant neoplasm of right kidney, except renal pelvis: Secondary | ICD-10-CM | POA: Diagnosis not present

## 2017-11-14 DIAGNOSIS — K5790 Diverticulosis of intestine, part unspecified, without perforation or abscess without bleeding: Secondary | ICD-10-CM | POA: Diagnosis not present

## 2017-11-14 DIAGNOSIS — C649 Malignant neoplasm of unspecified kidney, except renal pelvis: Secondary | ICD-10-CM | POA: Diagnosis not present

## 2017-11-14 DIAGNOSIS — Z905 Acquired absence of kidney: Secondary | ICD-10-CM | POA: Diagnosis not present

## 2017-11-15 DIAGNOSIS — Z1231 Encounter for screening mammogram for malignant neoplasm of breast: Secondary | ICD-10-CM | POA: Diagnosis not present

## 2017-11-22 DIAGNOSIS — M8589 Other specified disorders of bone density and structure, multiple sites: Secondary | ICD-10-CM | POA: Diagnosis not present

## 2017-11-23 ENCOUNTER — Telehealth: Payer: Self-pay | Admitting: Family Medicine

## 2017-11-23 DIAGNOSIS — D509 Iron deficiency anemia, unspecified: Secondary | ICD-10-CM

## 2017-11-23 DIAGNOSIS — E7800 Pure hypercholesterolemia, unspecified: Secondary | ICD-10-CM

## 2017-11-23 DIAGNOSIS — Z0000 Encounter for general adult medical examination without abnormal findings: Secondary | ICD-10-CM

## 2017-11-23 DIAGNOSIS — E559 Vitamin D deficiency, unspecified: Secondary | ICD-10-CM

## 2017-11-23 NOTE — Telephone Encounter (Signed)
-----   Message from Eustace Pen, LPN sent at 03/15/5182 10:37 PM EST ----- Regarding: Labs 2/11 Lab orders needed. Thank you.  Insurance:  [employer]

## 2017-11-26 ENCOUNTER — Ambulatory Visit (INDEPENDENT_AMBULATORY_CARE_PROVIDER_SITE_OTHER): Payer: Medicare HMO

## 2017-11-26 VITALS — BP 110/74 | HR 58 | Temp 97.5°F | Ht 59.0 in | Wt 111.5 lb

## 2017-11-26 DIAGNOSIS — D509 Iron deficiency anemia, unspecified: Secondary | ICD-10-CM | POA: Diagnosis not present

## 2017-11-26 DIAGNOSIS — E7800 Pure hypercholesterolemia, unspecified: Secondary | ICD-10-CM

## 2017-11-26 DIAGNOSIS — Z0000 Encounter for general adult medical examination without abnormal findings: Secondary | ICD-10-CM | POA: Diagnosis not present

## 2017-11-26 DIAGNOSIS — E559 Vitamin D deficiency, unspecified: Secondary | ICD-10-CM

## 2017-11-26 LAB — COMPREHENSIVE METABOLIC PANEL
ALK PHOS: 58 U/L (ref 39–117)
ALT: 14 U/L (ref 0–35)
AST: 18 U/L (ref 0–37)
Albumin: 4.6 g/dL (ref 3.5–5.2)
BUN: 18 mg/dL (ref 6–23)
CO2: 33 mEq/L — ABNORMAL HIGH (ref 19–32)
Calcium: 9.9 mg/dL (ref 8.4–10.5)
Chloride: 96 mEq/L (ref 96–112)
Creatinine, Ser: 0.91 mg/dL (ref 0.40–1.20)
GFR: 63.84 mL/min (ref >60.00)
GLUCOSE: 99 mg/dL (ref 70–99)
POTASSIUM: 4.5 meq/L (ref 3.5–5.1)
SODIUM: 135 meq/L (ref 135–145)
TOTAL PROTEIN: 6.6 g/dL (ref 6.0–8.3)
Total Bilirubin: 0.8 mg/dL (ref 0.2–1.2)

## 2017-11-26 LAB — CBC WITH DIFFERENTIAL/PLATELET
Basophils Absolute: 0.1 10*3/uL (ref 0.0–0.1)
Basophils Relative: 1 % (ref 0.0–3.0)
EOS PCT: 4.6 % (ref 0.0–5.0)
Eosinophils Absolute: 0.3 10*3/uL (ref 0.0–0.7)
HCT: 38.9 % (ref 36.0–46.0)
Hemoglobin: 13.4 g/dL (ref 12.0–15.0)
LYMPHS ABS: 2 10*3/uL (ref 0.7–4.0)
Lymphocytes Relative: 30.9 % (ref 12.0–46.0)
MCHC: 34.5 g/dL (ref 30.0–36.0)
MCV: 89.9 fl (ref 78.0–100.0)
Monocytes Absolute: 0.5 10*3/uL (ref 0.1–1.0)
Monocytes Relative: 8.1 % (ref 3.0–12.0)
NEUTROS ABS: 3.5 10*3/uL (ref 1.4–7.7)
NEUTROS PCT: 55.4 % (ref 43.0–77.0)
PLATELETS: 337 10*3/uL (ref 150.0–400.0)
RBC: 4.32 Mil/uL (ref 3.87–5.11)
RDW: 12.7 % (ref 11.5–15.5)
WBC: 6.4 10*3/uL (ref 4.0–10.5)

## 2017-11-26 LAB — LIPID PANEL
Cholesterol: 230 mg/dL — ABNORMAL HIGH (ref 0–200)
HDL: 85.3 mg/dL (ref >39.00)
LDL Cholesterol: 134 mg/dL — ABNORMAL HIGH (ref 0–99)
NONHDL: 144.73
Total CHOL/HDL Ratio: 3
Triglycerides: 55 mg/dL (ref 0.0–149.0)
VLDL: 11 mg/dL (ref 0.0–40.0)

## 2017-11-26 LAB — FERRITIN: FERRITIN: 122.9 ng/mL (ref 10.0–291.0)

## 2017-11-26 LAB — TSH: TSH: 4.16 u[IU]/mL (ref 0.35–4.50)

## 2017-11-26 LAB — VITAMIN D 25 HYDROXY (VIT D DEFICIENCY, FRACTURES): VITD: 34 ng/mL (ref 30.00–100.00)

## 2017-11-26 NOTE — Patient Instructions (Signed)
Ms. Tegtmeyer , Thank you for taking time to come for your Medicare Wellness Visit. I appreciate your ongoing commitment to your health goals. Please review the following plan we discussed and let me know if I can assist you in the future.   These are the goals we discussed: Goals    . Increase physical activity     Starting 11/26/2017, I will continue to walk for at least 45 minutes daily and to do strength training for 20-30 minutes 2-3 days per week.        This is a list of the screening recommended for you and due dates:  Health Maintenance  Topic Date Due  . Mammogram  11/15/2018  . Colon Cancer Screening  11/28/2021  . Tetanus Vaccine  11/10/2022  . Flu Shot  Completed  . DEXA scan (bone density measurement)  Completed  . Pneumonia vaccines  Completed   Preventive Care for Adults  A healthy lifestyle and preventive care can promote health and wellness. Preventive health guidelines for adults include the following key practices.  . A routine yearly physical is a good way to check with your health care provider about your health and preventive screening. It is a chance to share any concerns and updates on your health and to receive a thorough exam.  . Visit your dentist for a routine exam and preventive care every 6 months. Brush your teeth twice a day and floss once a day. Good oral hygiene prevents tooth decay and gum disease.  . The frequency of eye exams is based on your age, health, family medical history, use  of contact lenses, and other factors. Follow your health care provider's recommendations for frequency of eye exams.  . Eat a healthy diet. Foods like vegetables, fruits, whole grains, low-fat dairy products, and lean protein foods contain the nutrients you need without too many calories. Decrease your intake of foods high in solid fats, added sugars, and salt. Eat the right amount of calories for you. Get information about a proper diet from your health care provider,  if necessary.  . Regular physical exercise is one of the most important things you can do for your health. Most adults should get at least 150 minutes of moderate-intensity exercise (any activity that increases your heart rate and causes you to sweat) each week. In addition, most adults need muscle-strengthening exercises on 2 or more days a week.  Silver Sneakers may be a benefit available to you. To determine eligibility, you may visit the website: www.silversneakers.com or contact program at 530-410-2772 Mon-Fri between 8AM-8PM.   . Maintain a healthy weight. The body mass index (BMI) is a screening tool to identify possible weight problems. It provides an estimate of body fat based on height and weight. Your health care provider can find your BMI and can help you achieve or maintain a healthy weight.   For adults 20 years and older: ? A BMI below 18.5 is considered underweight. ? A BMI of 18.5 to 24.9 is normal. ? A BMI of 25 to 29.9 is considered overweight. ? A BMI of 30 and above is considered obese.   . Maintain normal blood lipids and cholesterol levels by exercising and minimizing your intake of saturated fat. Eat a balanced diet with plenty of fruit and vegetables. Blood tests for lipids and cholesterol should begin at age 68 and be repeated every 5 years. If your lipid or cholesterol levels are high, you are over 50, or you are at high  risk for heart disease, you may need your cholesterol levels checked more frequently. Ongoing high lipid and cholesterol levels should be treated with medicines if diet and exercise are not working.  . If you smoke, find out from your health care provider how to quit. If you do not use tobacco, please do not start.  . If you choose to drink alcohol, please do not consume more than 2 drinks per day. One drink is considered to be 12 ounces (355 mL) of beer, 5 ounces (148 mL) of wine, or 1.5 ounces (44 mL) of liquor.  . If you are 25-52 years old, ask  your health care provider if you should take aspirin to prevent strokes.  . Use sunscreen. Apply sunscreen liberally and repeatedly throughout the day. You should seek shade when your shadow is shorter than you. Protect yourself by wearing long sleeves, pants, a wide-brimmed hat, and sunglasses year round, whenever you are outdoors.  . Once a month, do a whole body skin exam, using a mirror to look at the skin on your back. Tell your health care provider of new moles, moles that have irregular borders, moles that are larger than a pencil eraser, or moles that have changed in shape or color.

## 2017-11-26 NOTE — Progress Notes (Signed)
PCP notes:   Health maintenance:  Mammogram - per pt, screening in Feb 2019 Bone density - per pt, test in Feb 2109  Abnormal screenings:   Hearing - failed  Hearing Screening   125Hz  250Hz  500Hz  1000Hz  2000Hz  3000Hz  4000Hz  6000Hz  8000Hz   Right ear:   40 40 40  0    Left ear:   40 40 40  0     Patient concerns:   None  Nurse concerns:  None  Next PCP appt:   12/03/17 @ 1130  I reviewed health advisor's note, was available for consultation, and agree with documentation and plan. Loura Pardon MD

## 2017-11-26 NOTE — Progress Notes (Signed)
Pre visit review using our clinic review tool, if applicable. No additional management support is needed unless otherwise documented below in the visit note. 
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
## 2017-11-27 ENCOUNTER — Encounter: Payer: Self-pay | Admitting: *Deleted

## 2017-12-03 ENCOUNTER — Encounter: Payer: Self-pay | Admitting: Family Medicine

## 2017-12-03 ENCOUNTER — Ambulatory Visit (INDEPENDENT_AMBULATORY_CARE_PROVIDER_SITE_OTHER): Payer: Medicare HMO | Admitting: Family Medicine

## 2017-12-03 VITALS — BP 118/64 | HR 62 | Temp 97.6°F | Ht 59.0 in | Wt 113.8 lb

## 2017-12-03 DIAGNOSIS — D509 Iron deficiency anemia, unspecified: Secondary | ICD-10-CM

## 2017-12-03 DIAGNOSIS — E559 Vitamin D deficiency, unspecified: Secondary | ICD-10-CM | POA: Diagnosis not present

## 2017-12-03 DIAGNOSIS — Z0000 Encounter for general adult medical examination without abnormal findings: Secondary | ICD-10-CM

## 2017-12-03 DIAGNOSIS — E7800 Pure hypercholesterolemia, unspecified: Secondary | ICD-10-CM

## 2017-12-03 DIAGNOSIS — M8589 Other specified disorders of bone density and structure, multiple sites: Secondary | ICD-10-CM | POA: Diagnosis not present

## 2017-12-03 NOTE — Progress Notes (Signed)
Subjective:    Patient ID: Christina Henry, female    DOB: 12/06/1940, 77 y.o.   MRN: [id number]  HPI  Here for health maintenance exam and to review chronic medical problems     Wt Readings from Last 3 Encounters:  12/03/17 113 lb 12 oz (51.6 kg)  11/26/17 111 lb 8 oz (50.6 kg)  07/11/17 113 lb (51.3 kg)  taking good care of herself  Is very active / and also lifts wts and uses bike  22.97 kg/m   Had amw on 2/12 Missed 4000 Hz for hearing bilat - does not notice any hearing loss   Mammogram 2/19 [facility]/negative  Self breast exam -no lumps   Gyn care-Dr Gaetano Net F/u for endometrial issue  Did not want to f/u this year  Her CT showed "tiny bubbles of gas" in fundus  Also thinks she has some prolapse as well/occ bothersome   Hx of renal cell carcinoma with partial nephrectomy in the past  Doing well and her last CT was normal /no cancer Only has to have one more and she will be done   zostavax 2/15  Colonoscopy 2/18 - normal / no recall due to age   dexa 2/19 per pt  Osteopenia - recent dexa 2/19 -improved per pt (pending report) Was on fosamax for 5 y  Vit d 34.0  Hyperlipidemia  Lab Results  Component Value Date   CHOL 230 (H) 11/26/2017   CHOL 215 (H) 11/12/2016   CHOL 204 (H) 11/11/2015   Lab Results  Component Value Date   HDL 85.30 11/26/2017   HDL 85.50 11/12/2016   HDL 84.20 11/11/2015   Lab Results  Component Value Date   LDLCALC 134 (H) 11/26/2017   LDLCALC 116 (H) 11/12/2016   LDLCALC 108 (H) 11/11/2015   Lab Results  Component Value Date   TRIG 55.0 11/26/2017   TRIG 70.0 11/12/2016   TRIG 58.0 11/11/2015   Lab Results  Component Value Date   CHOLHDL 3 11/26/2017   CHOLHDL 3 11/12/2016   CHOLHDL 2 11/11/2015   Lab Results  Component Value Date   LDLDIRECT 122.4 11/03/2013   LDLDIRECT 92.7 11/05/2012   LDLDIRECT 117.8 10/31/2011  LDL is up  Was eating more red meat  She has to cook for her spouse  Make effort to eat more baked  fish    H/o anemia Lab Results  Component Value Date   WBC 6.4 11/26/2017   HGB 13.4 11/26/2017   HCT 38.9 11/26/2017   MCV 89.9 11/26/2017   PLT 337.0 11/26/2017   Lab Results  Component Value Date   FERRITIN 122.9 11/26/2017   good  No longer on iron  Lab Results  Component Value Date   CREATININE 0.91 11/26/2017   BUN 18 11/26/2017   NA 135 11/26/2017   K 4.5 11/26/2017   CL 96 11/26/2017   CO2 33 (H) 11/26/2017   Lab Results  Component Value Date   ALT 14 11/26/2017   AST 18 11/26/2017   ALKPHOS 58 11/26/2017   BILITOT 0.8 11/26/2017    Lab Results  Component Value Date   TSH 4.16 11/26/2017     Review of Systems  Constitutional: Negative for activity change, appetite change, fatigue, fever and unexpected weight change.  HENT: Negative for congestion, ear pain, rhinorrhea, sinus pressure and sore throat.   Eyes: Negative for pain, redness and visual disturbance.  Respiratory: Negative for cough, shortness of breath and wheezing.   Cardiovascular: Negative  for chest pain and palpitations.  Gastrointestinal: Negative for abdominal pain, blood in stool, constipation and diarrhea.  Endocrine: Negative for polydipsia and polyuria.  Genitourinary: Negative for dysuria, frequency, urgency and vaginal bleeding.  Musculoskeletal: Negative for arthralgias, back pain and myalgias.       Feet get sore/instep -? Arthritis   Skin: Negative for pallor and rash.  Allergic/Immunologic: Negative for environmental allergies.  Neurological: Negative for dizziness, syncope and headaches.  Hematological: Negative for adenopathy. Does not bruise/bleed easily.  Psychiatric/Behavioral: Negative for decreased concentration and dysphoric mood. The patient is not nervous/anxious.        Objective:   Physical Exam  Constitutional: She appears well-developed and well-nourished. No distress.  Well appearing   HENT:  Head: Normocephalic and atraumatic.  Right Ear: External ear  normal.  Left Ear: External ear normal.  Mouth/Throat: Oropharynx is clear and moist.  Eyes: Conjunctivae and EOM are normal. Pupils are equal, round, and reactive to light. No scleral icterus.  Neck: Normal range of motion. Neck supple. No JVD present. Carotid bruit is not present. No thyromegaly present.  Cardiovascular: Normal rate, regular rhythm, normal heart sounds and intact distal pulses. Exam reveals no gallop.  Pulmonary/Chest: Effort normal and breath sounds normal. No respiratory distress. She has no wheezes. She exhibits no tenderness.  Abdominal: Soft. Bowel sounds are normal. She exhibits no distension, no abdominal bruit and no mass. There is no tenderness.  Genitourinary: No breast swelling, tenderness, discharge or bleeding.  Genitourinary Comments: Breast exam: No mass, nodules, thickening, tenderness, bulging, retraction, inflamation, nipple discharge or skin changes noted.  No axillary or clavicular LA.      Musculoskeletal: Normal range of motion. She exhibits no edema or tenderness.  No kyphosis   Lymphadenopathy:    She has no cervical adenopathy.  Neurological: She is alert. She has normal reflexes. No cranial nerve deficit. She exhibits normal muscle tone. Coordination normal.  Skin: Skin is warm and dry. No rash noted. No erythema. No pallor.  Solar lentigines diffusely   Psychiatric: She has a normal mood and affect.  Pleasant/ good mood          Assessment & Plan:   Problem List Items Addressed This Visit      Musculoskeletal and Integument   Osteopenia    Improved with recent dexa  D level in 35s- will double her daily intake  Disc need for calcium/ vitamin D/ wt bearing exercise and bone density test every 2 y to monitor Disc safety/ fracture risk in detail          Other   RESOLVED: Anemia, iron deficiency    Resolved after oral iron       Hyperlipidemia    Disc goals for lipids and reasons to control them Rev labs with pt Rev low sat fat  diet in detail LDL is up this time  Plan made to eat less trans/sat fats  Handout given  Continue to follow      Routine general medical examination at a health care facility - Primary    Reviewed health habits including diet and exercise and skin cancer prevention Reviewed appropriate screening tests for age  Also reviewed health mt list, fam hx and immunization status , as well as social and family history   See HPI Labs reviewed  amw rev She will f/u with gyn provider for visit (poss her last) - to disc prolapse and finding on CT Will inc her D to 2 daily for bone  health Disc plan for low cholesterol eating       Vitamin D deficiency

## 2017-12-03 NOTE — Patient Instructions (Addendum)
See Dr Gaetano Net for visit and discuss your CT scan findings and also the prolapse   For cholesterol  Avoid red meat/ fried foods/ egg yolks/ fatty breakfast meats/ butter, cheese and high fat dairy/ and shellfish    Increase your vitamin D tablet to 2 daily    Stay active

## 2017-12-04 NOTE — Assessment & Plan Note (Signed)
Resolved after oral iron

## 2017-12-04 NOTE — Assessment & Plan Note (Signed)
Disc goals for lipids and reasons to control them Rev labs with pt Rev low sat fat diet in detail LDL is up this time  Plan made to eat less trans/sat fats  Handout given  Continue to follow

## 2017-12-04 NOTE — Assessment & Plan Note (Signed)
Improved with recent dexa  D level in 30s- will double her daily intake  Disc need for calcium/ vitamin D/ wt bearing exercise and bone density test every 2 y to monitor Disc safety/ fracture risk in detail

## 2017-12-04 NOTE — Assessment & Plan Note (Signed)
Reviewed health habits including diet and exercise and skin cancer prevention Reviewed appropriate screening tests for age  Also reviewed health mt list, fam hx and immunization status , as well as social and family history   See HPI Labs reviewed  amw rev She will f/u with gyn provider for visit (poss her last) - to disc prolapse and finding on CT Will inc her D to 2 daily for bone health Disc plan for low cholesterol eating

## 2017-12-16 DIAGNOSIS — R69 Illness, unspecified: Secondary | ICD-10-CM | POA: Diagnosis not present

## 2017-12-17 DIAGNOSIS — Z8249 Family history of ischemic heart disease and other diseases of the circulatory system: Secondary | ICD-10-CM | POA: Diagnosis not present

## 2017-12-17 DIAGNOSIS — Z809 Family history of malignant neoplasm, unspecified: Secondary | ICD-10-CM | POA: Diagnosis not present

## 2017-12-17 DIAGNOSIS — L820 Inflamed seborrheic keratosis: Secondary | ICD-10-CM | POA: Diagnosis not present

## 2017-12-17 DIAGNOSIS — L570 Actinic keratosis: Secondary | ICD-10-CM | POA: Diagnosis not present

## 2017-12-17 DIAGNOSIS — Z85828 Personal history of other malignant neoplasm of skin: Secondary | ICD-10-CM | POA: Diagnosis not present

## 2017-12-17 DIAGNOSIS — L814 Other melanin hyperpigmentation: Secondary | ICD-10-CM | POA: Diagnosis not present

## 2018-01-15 DIAGNOSIS — Z6822 Body mass index (BMI) 22.0-22.9, adult: Secondary | ICD-10-CM | POA: Diagnosis not present

## 2018-01-15 DIAGNOSIS — N952 Postmenopausal atrophic vaginitis: Secondary | ICD-10-CM | POA: Diagnosis not present

## 2018-01-15 DIAGNOSIS — N8189 Other female genital prolapse: Secondary | ICD-10-CM | POA: Diagnosis not present

## 2018-01-15 DIAGNOSIS — Z01419 Encounter for gynecological examination (general) (routine) without abnormal findings: Secondary | ICD-10-CM | POA: Diagnosis not present

## 2018-06-30 DIAGNOSIS — R69 Illness, unspecified: Secondary | ICD-10-CM | POA: Diagnosis not present

## 2018-07-21 DIAGNOSIS — R69 Illness, unspecified: Secondary | ICD-10-CM | POA: Diagnosis not present

## 2018-07-31 ENCOUNTER — Ambulatory Visit (INDEPENDENT_AMBULATORY_CARE_PROVIDER_SITE_OTHER): Payer: Medicare HMO

## 2018-07-31 DIAGNOSIS — Z23 Encounter for immunization: Secondary | ICD-10-CM

## 2018-10-20 DIAGNOSIS — C641 Malignant neoplasm of right kidney, except renal pelvis: Secondary | ICD-10-CM | POA: Diagnosis not present

## 2018-10-20 DIAGNOSIS — C649 Malignant neoplasm of unspecified kidney, except renal pelvis: Secondary | ICD-10-CM | POA: Diagnosis not present

## 2018-10-20 DIAGNOSIS — Z08 Encounter for follow-up examination after completed treatment for malignant neoplasm: Secondary | ICD-10-CM | POA: Diagnosis not present

## 2018-10-20 DIAGNOSIS — Z905 Acquired absence of kidney: Secondary | ICD-10-CM | POA: Diagnosis not present

## 2018-10-20 DIAGNOSIS — C7900 Secondary malignant neoplasm of unspecified kidney and renal pelvis: Secondary | ICD-10-CM | POA: Diagnosis not present

## 2018-10-20 DIAGNOSIS — Z85528 Personal history of other malignant neoplasm of kidney: Secondary | ICD-10-CM | POA: Diagnosis not present

## 2018-10-23 DIAGNOSIS — C641 Malignant neoplasm of right kidney, except renal pelvis: Secondary | ICD-10-CM | POA: Diagnosis not present

## 2018-11-21 ENCOUNTER — Encounter: Payer: Self-pay | Admitting: Family Medicine

## 2018-11-21 DIAGNOSIS — Z1231 Encounter for screening mammogram for malignant neoplasm of breast: Secondary | ICD-10-CM | POA: Diagnosis not present

## 2018-11-28 DIAGNOSIS — H5213 Myopia, bilateral: Secondary | ICD-10-CM | POA: Diagnosis not present

## 2018-11-28 DIAGNOSIS — H524 Presbyopia: Secondary | ICD-10-CM | POA: Diagnosis not present

## 2018-11-28 DIAGNOSIS — H25813 Combined forms of age-related cataract, bilateral: Secondary | ICD-10-CM | POA: Diagnosis not present

## 2018-11-28 DIAGNOSIS — H52223 Regular astigmatism, bilateral: Secondary | ICD-10-CM | POA: Diagnosis not present

## 2018-12-07 ENCOUNTER — Telehealth: Payer: Self-pay | Admitting: Family Medicine

## 2018-12-07 DIAGNOSIS — E559 Vitamin D deficiency, unspecified: Secondary | ICD-10-CM

## 2018-12-07 DIAGNOSIS — Z0000 Encounter for general adult medical examination without abnormal findings: Secondary | ICD-10-CM

## 2018-12-07 DIAGNOSIS — E7800 Pure hypercholesterolemia, unspecified: Secondary | ICD-10-CM

## 2018-12-07 NOTE — Telephone Encounter (Signed)
-----   Message from Eustace Pen, LPN sent at 8/90/2284  3:00 PM EST ----- Regarding: Labs 2/24 Lab orders needed. Thank you.

## 2018-12-08 ENCOUNTER — Ambulatory Visit (INDEPENDENT_AMBULATORY_CARE_PROVIDER_SITE_OTHER): Payer: Medicare HMO

## 2018-12-08 VITALS — BP 118/80 | HR 73 | Temp 97.6°F | Ht 59.0 in | Wt 110.0 lb

## 2018-12-08 DIAGNOSIS — E7800 Pure hypercholesterolemia, unspecified: Secondary | ICD-10-CM | POA: Diagnosis not present

## 2018-12-08 DIAGNOSIS — E559 Vitamin D deficiency, unspecified: Secondary | ICD-10-CM

## 2018-12-08 DIAGNOSIS — Z0000 Encounter for general adult medical examination without abnormal findings: Secondary | ICD-10-CM | POA: Diagnosis not present

## 2018-12-08 LAB — LIPID PANEL
CHOL/HDL RATIO: 2
Cholesterol: 210 mg/dL — ABNORMAL HIGH (ref 0–200)
HDL: 94.1 mg/dL (ref >39.00)
LDL CALC: 107 mg/dL — AB (ref 0–99)
NonHDL: 115.61
Triglycerides: 41 mg/dL (ref 0.0–149.0)
VLDL: 8.2 mg/dL (ref 0.0–40.0)

## 2018-12-08 LAB — CBC WITH DIFFERENTIAL/PLATELET
Basophils Absolute: 0.1 10*3/uL (ref 0.0–0.1)
Basophils Relative: 1 % (ref 0.0–3.0)
Eosinophils Absolute: 0.2 10*3/uL (ref 0.0–0.7)
Eosinophils Relative: 3.1 % (ref 0.0–5.0)
HEMATOCRIT: 38.5 % (ref 36.0–46.0)
HEMOGLOBIN: 13.3 g/dL (ref 12.0–15.0)
LYMPHS ABS: 1.8 10*3/uL (ref 0.7–4.0)
LYMPHS PCT: 30.5 % (ref 12.0–46.0)
MCHC: 34.4 g/dL (ref 30.0–36.0)
MCV: 90.4 fl (ref 78.0–100.0)
MONOS PCT: 9.3 % (ref 3.0–12.0)
Monocytes Absolute: 0.5 10*3/uL (ref 0.1–1.0)
NEUTROS ABS: 3.2 10*3/uL (ref 1.4–7.7)
Neutrophils Relative %: 56.1 % (ref 43.0–77.0)
Platelets: 317 10*3/uL (ref 150.0–400.0)
RBC: 4.26 Mil/uL (ref 3.87–5.11)
RDW: 12.4 % (ref 11.5–15.5)
WBC: 5.7 10*3/uL (ref 4.0–10.5)

## 2018-12-08 LAB — COMPREHENSIVE METABOLIC PANEL
ALT: 16 U/L (ref 0–35)
AST: 22 U/L (ref 0–37)
Albumin: 4.6 g/dL (ref 3.5–5.2)
Alkaline Phosphatase: 58 U/L (ref 39–117)
BUN: 19 mg/dL (ref 6–23)
CALCIUM: 9.5 mg/dL (ref 8.4–10.5)
CO2: 29 mEq/L (ref 19–32)
Chloride: 98 mEq/L (ref 96–112)
Creatinine, Ser: 0.92 mg/dL (ref 0.40–1.20)
GFR: 59.15 mL/min — ABNORMAL LOW (ref >60.00)
Glucose, Bld: 90 mg/dL (ref 70–99)
Potassium: 4.2 mEq/L (ref 3.5–5.1)
Sodium: 135 mEq/L (ref 135–145)
Total Bilirubin: 0.9 mg/dL (ref 0.2–1.2)
Total Protein: 6.7 g/dL (ref 6.0–8.3)

## 2018-12-08 LAB — TSH: TSH: 1.59 u[IU]/mL (ref 0.35–4.50)

## 2018-12-08 LAB — VITAMIN D 25 HYDROXY (VIT D DEFICIENCY, FRACTURES): VITD: 42.31 ng/mL (ref 30.00–100.00)

## 2018-12-08 NOTE — Progress Notes (Signed)
PCP notes:   Health maintenance:  No gaps identified.  Abnormal screenings:   None  Patient concerns:   None  Nurse concerns:  None  Next PCP appt:   12/15/18 @ 0830  I reviewed health advisor's note, was available for consultation, and agree with documentation and plan. Loura Pardon MD
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
## 2018-12-08 NOTE — Patient Instructions (Addendum)
Christina Henry , Thank you for taking time to come for your Medicare Wellness Visit. I appreciate your ongoing commitment to your health goals. Please review the following plan we discussed and let me know if I can assist you in the future.   These are the goals we discussed: Goals    . Increase physical activity     Starting 12/08/18, I will continue to walk for at least 45 minutes daily and to do strength training for 10 minutes 2-3 days per week.        This is a list of the screening recommended for you and due dates:  Health Maintenance  Topic Date Due  . Mammogram  11/22/2019  . Colon Cancer Screening  11/28/2021  . Tetanus Vaccine  11/10/2022  . Flu Shot  Completed  . DEXA scan (bone density measurement)  Completed  . Pneumonia vaccines  Completed   Preventive Care for Adults  A healthy lifestyle and preventive care can promote health and wellness. Preventive health guidelines for adults include the following key practices.  . A routine yearly physical is a good way to check with your health care provider about your health and preventive screening. It is a chance to share any concerns and updates on your health and to receive a thorough exam.  . Visit your dentist for a routine exam and preventive care every 6 months. Brush your teeth twice a day and floss once a day. Good oral hygiene prevents tooth decay and gum disease.  . The frequency of eye exams is based on your age, health, family medical history, use  of contact lenses, and other factors. Follow your health care provider's recommendations for frequency of eye exams.  . Eat a healthy diet. Foods like vegetables, fruits, whole grains, low-fat dairy products, and lean protein foods contain the nutrients you need without too many calories. Decrease your intake of foods high in solid fats, added sugars, and salt. Eat the right amount of calories for you. Get information about a proper diet from your health care provider, if  necessary.  . Regular physical exercise is one of the most important things you can do for your health. Most adults should get at least 150 minutes of moderate-intensity exercise (any activity that increases your heart rate and causes you to sweat) each week. In addition, most adults need muscle-strengthening exercises on 2 or more days a week.  Silver Sneakers may be a benefit available to you. To determine eligibility, you may visit the website: www.silversneakers.com or contact program at [phone] Mon-Fri between 8AM-8PM.   . Maintain a healthy weight. The body mass index (BMI) is a screening tool to identify possible weight problems. It provides an estimate of body fat based on height and weight. Your health care provider can find your BMI and can help you achieve or maintain a healthy weight.   For adults 20 years and older: ? A BMI below 18.5 is considered underweight. ? A BMI of 18.5 to 24.9 is normal. ? A BMI of 25 to 29.9 is considered overweight. ? A BMI of 30 and above is considered obese.   . Maintain normal blood lipids and cholesterol levels by exercising and minimizing your intake of saturated fat. Eat a balanced diet with plenty of fruit and vegetables. Blood tests for lipids and cholesterol should begin at age 54 and be repeated every 5 years. If your lipid or cholesterol levels are high, you are over 50, or you are at high  risk for heart disease, you may need your cholesterol levels checked more frequently. Ongoing high lipid and cholesterol levels should be treated with medicines if diet and exercise are not working.  . If you smoke, find out from your health care provider how to quit. If you do not use tobacco, please do not start.  . If you choose to drink alcohol, please do not consume more than 2 drinks per day. One drink is considered to be 12 ounces (355 mL) of beer, 5 ounces (148 mL) of wine, or 1.5 ounces (44 mL) of liquor.  . If you are 10-62 years old, ask your  health care provider if you should take aspirin to prevent strokes.  . Use sunscreen. Apply sunscreen liberally and repeatedly throughout the day. You should seek shade when your shadow is shorter than you. Protect yourself by wearing long sleeves, pants, a wide-brimmed hat, and sunglasses year round, whenever you are outdoors.  . Once a month, do a whole body skin exam, using a mirror to look at the skin on your back. Tell your health care provider of new moles, moles that have irregular borders, moles that are larger than a pencil eraser, or moles that have changed in shape or color.

## 2018-12-15 ENCOUNTER — Ambulatory Visit (INDEPENDENT_AMBULATORY_CARE_PROVIDER_SITE_OTHER): Payer: Medicare HMO | Admitting: Family Medicine

## 2018-12-15 ENCOUNTER — Encounter: Payer: Self-pay | Admitting: Family Medicine

## 2018-12-15 VITALS — BP 130/70 | HR 50 | Temp 98.2°F | Ht 59.0 in | Wt 111.4 lb

## 2018-12-15 DIAGNOSIS — E559 Vitamin D deficiency, unspecified: Secondary | ICD-10-CM

## 2018-12-15 DIAGNOSIS — Z0000 Encounter for general adult medical examination without abnormal findings: Secondary | ICD-10-CM | POA: Diagnosis not present

## 2018-12-15 DIAGNOSIS — E7800 Pure hypercholesterolemia, unspecified: Secondary | ICD-10-CM | POA: Diagnosis not present

## 2018-12-15 DIAGNOSIS — M8589 Other specified disorders of bone density and structure, multiple sites: Secondary | ICD-10-CM | POA: Diagnosis not present
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
## 2018-12-15 NOTE — Patient Instructions (Signed)
Continue good diet and exercise habits   Labs look good   Take care of yourself   If you are interested in the new shingles vaccine (Shingrix) - call your local pharmacy to check on coverage and availability  If affordable, get on a wait list at your pharmacy to get the vaccine.

## 2018-12-15 NOTE — Assessment & Plan Note (Signed)
Vitamin D level is therapeutic with current supplementation Disc importance of this to bone and overall health Level is 42.3 -improved

## 2018-12-15 NOTE — Assessment & Plan Note (Signed)
Reviewed health habits including diet and exercise and skin cancer prevention Reviewed appropriate screening tests for age  Also reviewed health mt list, fam hx and immunization status , as well as social and family history   For dermatology check soon Also gyn visit to discuss a pessary Disc shingrix vaccine  Rev amw  Rev urology note-signed off/cancer free  utd breast cancer screening  Enc continued good self care

## 2018-12-15 NOTE — Assessment & Plan Note (Signed)
Disc goals for lipids and reasons to control them Rev last labs with pt Rev low sat fat diet in detail Improvement with better diet  Enc her to keep it up

## 2018-12-15 NOTE — Assessment & Plan Note (Signed)
No falls or fractures  D level 42.3 slt improvement dexa 2/19  Continue to follow

## 2018-12-30 DIAGNOSIS — R69 Illness, unspecified: Secondary | ICD-10-CM | POA: Diagnosis not present

## 2019-01-27 DIAGNOSIS — Z01419 Encounter for gynecological examination (general) (routine) without abnormal findings: Secondary | ICD-10-CM | POA: Diagnosis not present

## 2019-01-27 DIAGNOSIS — Z124 Encounter for screening for malignant neoplasm of cervix: Secondary | ICD-10-CM | POA: Diagnosis not present

## 2019-01-27 DIAGNOSIS — Z6821 Body mass index (BMI) 21.0-21.9, adult: Secondary | ICD-10-CM | POA: Diagnosis not present

## 2019-01-27 DIAGNOSIS — N819 Female genital prolapse, unspecified: Secondary | ICD-10-CM | POA: Diagnosis not present

## 2019-02-02 DIAGNOSIS — N819 Female genital prolapse, unspecified: Secondary | ICD-10-CM | POA: Diagnosis not present

## 2019-02-03 DIAGNOSIS — N819 Female genital prolapse, unspecified: Secondary | ICD-10-CM | POA: Diagnosis not present

## 2019-02-05 DIAGNOSIS — N819 Female genital prolapse, unspecified: Secondary | ICD-10-CM | POA: Diagnosis not present

## 2019-02-12 DIAGNOSIS — N819 Female genital prolapse, unspecified: Secondary | ICD-10-CM | POA: Diagnosis not present

## 2019-04-13 DIAGNOSIS — N819 Female genital prolapse, unspecified: Secondary | ICD-10-CM | POA: Diagnosis not present

## 2019-04-13 DIAGNOSIS — N8182 Incompetence or weakening of pubocervical tissue: Secondary | ICD-10-CM | POA: Diagnosis not present

## 2019-04-28 DIAGNOSIS — Z85828 Personal history of other malignant neoplasm of skin: Secondary | ICD-10-CM | POA: Diagnosis not present

## 2019-04-28 DIAGNOSIS — L820 Inflamed seborrheic keratosis: Secondary | ICD-10-CM | POA: Diagnosis not present

## 2019-04-28 DIAGNOSIS — B880 Other acariasis: Secondary | ICD-10-CM | POA: Diagnosis not present

## 2019-04-28 DIAGNOSIS — L814 Other melanin hyperpigmentation: Secondary | ICD-10-CM | POA: Diagnosis not present

## 2019-04-28 DIAGNOSIS — W57XXXA Bitten or stung by nonvenomous insect and other nonvenomous arthropods, initial encounter: Secondary | ICD-10-CM | POA: Diagnosis not present

## 2019-04-28 DIAGNOSIS — L570 Actinic keratosis: Secondary | ICD-10-CM | POA: Diagnosis not present

## 2019-07-06 DIAGNOSIS — R69 Illness, unspecified: Secondary | ICD-10-CM | POA: Diagnosis not present

## 2019-07-29 DIAGNOSIS — R69 Illness, unspecified: Secondary | ICD-10-CM | POA: Diagnosis not present

## 2019-08-12 DIAGNOSIS — N8189 Other female genital prolapse: Secondary | ICD-10-CM | POA: Diagnosis not present

## 2019-09-06 DIAGNOSIS — G8929 Other chronic pain: Secondary | ICD-10-CM | POA: Diagnosis not present

## 2019-09-06 DIAGNOSIS — Z85528 Personal history of other malignant neoplasm of kidney: Secondary | ICD-10-CM | POA: Diagnosis not present

## 2019-09-06 DIAGNOSIS — F4321 Adjustment disorder with depressed mood: Secondary | ICD-10-CM | POA: Diagnosis not present

## 2019-09-06 DIAGNOSIS — Z85828 Personal history of other malignant neoplasm of skin: Secondary | ICD-10-CM | POA: Diagnosis not present

## 2019-09-06 DIAGNOSIS — R69 Illness, unspecified: Secondary | ICD-10-CM | POA: Diagnosis not present

## 2019-09-06 DIAGNOSIS — Z8249 Family history of ischemic heart disease and other diseases of the circulatory system: Secondary | ICD-10-CM | POA: Diagnosis not present

## 2019-09-06 DIAGNOSIS — Z885 Allergy status to narcotic agent status: Secondary | ICD-10-CM | POA: Diagnosis not present

## 2019-09-06 DIAGNOSIS — Z809 Family history of malignant neoplasm, unspecified: Secondary | ICD-10-CM | POA: Diagnosis not present

## 2019-09-06 DIAGNOSIS — M1990 Unspecified osteoarthritis, unspecified site: Secondary | ICD-10-CM | POA: Diagnosis not present

## 2019-12-04 ENCOUNTER — Encounter: Payer: Self-pay | Admitting: Family Medicine

## 2019-12-04 DIAGNOSIS — Z1231 Encounter for screening mammogram for malignant neoplasm of breast: Secondary | ICD-10-CM | POA: Diagnosis not present

## 2019-12-04 DIAGNOSIS — M8589 Other specified disorders of bone density and structure, multiple sites: Secondary | ICD-10-CM | POA: Diagnosis not present

## 2019-12-09 DIAGNOSIS — H25813 Combined forms of age-related cataract, bilateral: Secondary | ICD-10-CM | POA: Diagnosis not present

## 2019-12-09 DIAGNOSIS — H524 Presbyopia: Secondary | ICD-10-CM | POA: Diagnosis not present

## 2019-12-09 DIAGNOSIS — H5213 Myopia, bilateral: Secondary | ICD-10-CM | POA: Diagnosis not present

## 2019-12-09 DIAGNOSIS — H52223 Regular astigmatism, bilateral: Secondary | ICD-10-CM | POA: Diagnosis not present

## 2019-12-11 ENCOUNTER — Ambulatory Visit (INDEPENDENT_AMBULATORY_CARE_PROVIDER_SITE_OTHER): Payer: Medicare HMO

## 2019-12-11 ENCOUNTER — Other Ambulatory Visit: Payer: Self-pay

## 2019-12-11 ENCOUNTER — Ambulatory Visit: Payer: Medicare HMO

## 2019-12-11 VITALS — Wt 107.0 lb

## 2019-12-11 DIAGNOSIS — Z0000 Encounter for general adult medical examination without abnormal findings: Secondary | ICD-10-CM | POA: Diagnosis not present
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
## 2019-12-11 NOTE — Progress Notes (Signed)
PCP notes:  Health Maintenance: No gaps noted    Abnormal Screenings: none   Patient concerns: Left shoulder pain onset several months ago   Nurse concerns: none   Next PCP appt.: 12/16/2019 @ 9:30 am

## 2019-12-11 NOTE — Patient Instructions (Signed)
Christina Henry , Thank you for taking time to come for your Medicare Wellness Visit. I appreciate your ongoing commitment to your health goals. Please review the following plan we discussed and let me know if I can assist you in the future.   Screening recommendations/referrals: Colonoscopy: Up to date, completed 11/28/2016 Mammogram: Up to date, completed 12/04/2019 Bone Density: Up to date, completed 12/04/2019 Recommended yearly ophthalmology/optometry visit for glaucoma screening and checkup Recommended yearly dental visit for hygiene and checkup  Vaccinations: Influenza vaccine: Up to date, completed 07/16/2019 Pneumococcal vaccine: Completed series Tdap vaccine: Up to date, completed 11/10/2012  Shingles vaccine: completed per patient   Advanced directives: copy in chart  Conditions/risks identified: hyperlipidemia  Next appointment: 12/16/2019 @ 9:30 am    Preventive Care 41 Years and Older, Female Preventive care refers to lifestyle choices and visits with your health care provider that can promote health and wellness. What does preventive care include?  A yearly physical exam. This is also called an annual well check.  Dental exams once or twice a year.  Routine eye exams. Ask your health care provider how often you should have your eyes checked.  Personal lifestyle choices, including:  Daily care of your teeth and gums.  Regular physical activity.  Eating a healthy diet.  Avoiding tobacco and drug use.  Limiting alcohol use.  Practicing safe sex.  Taking low-dose aspirin every day.  Taking vitamin and mineral supplements as recommended by your health care provider. What happens during an annual well check? The services and screenings done by your health care provider during your annual well check will depend on your age, overall health, lifestyle risk factors, and family history of disease. Counseling  Your health care provider may ask you questions about  your:  Alcohol use.  Tobacco use.  Drug use.  Emotional well-being.  Home and relationship well-being.  Sexual activity.  Eating habits.  History of falls.  Memory and ability to understand (cognition).  Work and work Statistician.  Reproductive health. Screening  You may have the following tests or measurements:  Height, weight, and BMI.  Blood pressure.  Lipid and cholesterol levels. These may be checked every 5 years, or more frequently if you are over 89 years old.  Skin check.  Lung cancer screening. You may have this screening every year starting at age 40 if you have a 30-pack-year history of smoking and currently smoke or have quit within the past 15 years.  Fecal occult blood test (FOBT) of the stool. You may have this test every year starting at age 83.  Flexible sigmoidoscopy or colonoscopy. You may have a sigmoidoscopy every 5 years or a colonoscopy every 10 years starting at age 29.  Hepatitis C blood test.  Hepatitis B blood test.  Sexually transmitted disease (STD) testing.  Diabetes screening. This is done by checking your blood sugar (glucose) after you have not eaten for a while (fasting). You may have this done every 1-3 years.  Bone density scan. This is done to screen for osteoporosis. You may have this done starting at age 73.  Mammogram. This may be done every 1-2 years. Talk to your health care provider about how often you should have regular mammograms. Talk with your health care provider about your test results, treatment options, and if necessary, the need for more tests. Vaccines  Your health care provider may recommend certain vaccines, such as:  Influenza vaccine. This is recommended every year.  Tetanus, diphtheria, and acellular pertussis (  Tdap, Td) vaccine. You may need a Td booster every 10 years.  Zoster vaccine. You may need this after age 28.  Pneumococcal 13-valent conjugate (PCV13) vaccine. One dose is recommended  after age 70.  Pneumococcal polysaccharide (PPSV23) vaccine. One dose is recommended after age 53. Talk to your health care provider about which screenings and vaccines you need and how often you need them. This information is not intended to replace advice given to you by your health care provider. Make sure you discuss any questions you have with your health care provider. Document Released: 10/28/2015 Document Revised: 06/20/2016 Document Reviewed: 08/02/2015 Elsevier Interactive Patient Education  2017 [place] Prevention in the Home Falls can cause injuries. They can happen to people of all ages. There are many things you can do to make your home safe and to help prevent falls. What can I do on the outside of my home?  Regularly fix the edges of walkways and driveways and fix any cracks.  Remove anything that might make you trip as you walk through a door, such as a raised step or threshold.  Trim any bushes or trees on the path to your home.  Use bright outdoor lighting.  Clear any walking paths of anything that might make someone trip, such as rocks or tools.  Regularly check to see if handrails are loose or broken. Make sure that both sides of any steps have handrails.  Any raised decks and porches should have guardrails on the edges.  Have any leaves, snow, or ice cleared regularly.  Use sand or salt on walking paths during winter.  Clean up any spills in your garage right away. This includes oil or grease spills. What can I do in the bathroom?  Use night lights.  Install grab bars by the toilet and in the tub and shower. Do not use towel bars as grab bars.  Use non-skid mats or decals in the tub or shower.  If you need to sit down in the shower, use a plastic, non-slip stool.  Keep the floor dry. Clean up any water that spills on the floor as soon as it happens.  Remove soap buildup in the tub or shower regularly.  Attach bath mats securely with  double-sided non-slip rug tape.  Do not have throw rugs and other things on the floor that can make you trip. What can I do in the bedroom?  Use night lights.  Make sure that you have a light by your bed that is easy to reach.  Do not use any sheets or blankets that are too big for your bed. They should not hang down onto the floor.  Have a firm chair that has side arms. You can use this for support while you get dressed.  Do not have throw rugs and other things on the floor that can make you trip. What can I do in the kitchen?  Clean up any spills right away.  Avoid walking on wet floors.  Keep items that you use a lot in easy-to-reach places.  If you need to reach something above you, use a strong step stool that has a grab bar.  Keep electrical cords out of the way.  Do not use floor polish or wax that makes floors slippery. If you must use wax, use non-skid floor wax.  Do not have throw rugs and other things on the floor that can make you trip. What can I do with my stairs?  Do  not leave any items on the stairs.  Make sure that there are handrails on both sides of the stairs and use them. Fix handrails that are broken or loose. Make sure that handrails are as long as the stairways.  Check any carpeting to make sure that it is firmly attached to the stairs. Fix any carpet that is loose or worn.  Avoid having throw rugs at the top or bottom of the stairs. If you do have throw rugs, attach them to the floor with carpet tape.  Make sure that you have a light switch at the top of the stairs and the bottom of the stairs. If you do not have them, ask someone to add them for you. What else can I do to help prevent falls?  Wear shoes that:  Do not have high heels.  Have rubber bottoms.  Are comfortable and fit you well.  Are closed at the toe. Do not wear sandals.  If you use a stepladder:  Make sure that it is fully opened. Do not climb a closed stepladder.  Make  sure that both sides of the stepladder are locked into place.  Ask someone to hold it for you, if possible.  Clearly mark and make sure that you can see:  Any grab bars or handrails.  First and last steps.  Where the edge of each step is.  Use tools that help you move around (mobility aids) if they are needed. These include:  Canes.  Walkers.  Scooters.  Crutches.  Turn on the lights when you go into a dark area. Replace any light bulbs as soon as they burn out.  Set up your furniture so you have a clear path. Avoid moving your furniture around.  If any of your floors are uneven, fix them.  If there are any pets around you, be aware of where they are.  Review your medicines with your doctor. Some medicines can make you feel dizzy. This can increase your chance of falling. Ask your doctor what other things that you can do to help prevent falls. This information is not intended to replace advice given to you by your health care provider. Make sure you discuss any questions you have with your health care provider. Document Released: 07/28/2009 Document Revised: 03/08/2016 Document Reviewed: 11/05/2014 Elsevier Interactive Patient Education  2017 [employer].

## 2019-12-13 ENCOUNTER — Ambulatory Visit: Payer: Medicare HMO | Attending: Internal Medicine

## 2019-12-13 ENCOUNTER — Telehealth: Payer: Self-pay | Admitting: Family Medicine

## 2019-12-13 DIAGNOSIS — E559 Vitamin D deficiency, unspecified: Secondary | ICD-10-CM

## 2019-12-13 DIAGNOSIS — Z23 Encounter for immunization: Secondary | ICD-10-CM | POA: Insufficient documentation

## 2019-12-13 DIAGNOSIS — E7800 Pure hypercholesterolemia, unspecified: Secondary | ICD-10-CM

## 2019-12-13 DIAGNOSIS — Z0000 Encounter for general adult medical examination without abnormal findings: Secondary | ICD-10-CM

## 2019-12-13 NOTE — Telephone Encounter (Signed)
-----   Message from Cloyd Stagers, RT sent at 12/02/2019  1:59 PM EST ----- Regarding: Lab Orders for Monday 3.1.2021 Please place lab orders for Monday 3.1.2021, office visit for physical on Wednesday 3.3.2021 Thank you, Dyke Maes RT(R)

## 2019-12-13 NOTE — Progress Notes (Signed)
   Covid-19 Vaccination Clinic  Name:  Christina Henry    MRN: [id number] DOB: [birth year]  12/13/2019  Christina Henry was observed post Covid-19 immunization for 15 minutes without incidence. She was provided with Vaccine Information Sheet and instruction to access the V-Safe system.   Christina Henry was instructed to call 911 with any severe reactions post vaccine: Marland Kitchen Difficulty breathing  . Swelling of your face and throat  . A fast heartbeat  . A bad rash all over your body  . Dizziness and weakness    Immunizations Administered    Name Date Dose VIS Date Route   Pfizer COVID-19 Vaccine 12/13/2019  8:48 AM 0.3 mL 09/25/2019 Intramuscular   Manufacturer: [place]   Lot: [id number]   [place]: [id number]

## 2019-12-14 ENCOUNTER — Other Ambulatory Visit (INDEPENDENT_AMBULATORY_CARE_PROVIDER_SITE_OTHER): Payer: Medicare HMO

## 2019-12-14 ENCOUNTER — Other Ambulatory Visit: Payer: Self-pay

## 2019-12-14 DIAGNOSIS — Z0000 Encounter for general adult medical examination without abnormal findings: Secondary | ICD-10-CM

## 2019-12-14 DIAGNOSIS — E7800 Pure hypercholesterolemia, unspecified: Secondary | ICD-10-CM

## 2019-12-14 DIAGNOSIS — E559 Vitamin D deficiency, unspecified: Secondary | ICD-10-CM | POA: Diagnosis not present

## 2019-12-14 LAB — VITAMIN D 25 HYDROXY (VIT D DEFICIENCY, FRACTURES): VITD: 52.94 ng/mL (ref 30.00–100.00)

## 2019-12-14 LAB — LIPID PANEL
Cholesterol: 206 mg/dL — ABNORMAL HIGH (ref 0–200)
HDL: 84.9 mg/dL (ref >39.00)
LDL Cholesterol: 106 mg/dL — ABNORMAL HIGH (ref 0–99)
NonHDL: 121.07
Total CHOL/HDL Ratio: 2
Triglycerides: 77 mg/dL (ref 0.0–149.0)
VLDL: 15.4 mg/dL (ref 0.0–40.0)

## 2019-12-14 LAB — CBC WITH DIFFERENTIAL/PLATELET
Basophils Absolute: 0.1 10*3/uL (ref 0.0–0.1)
Basophils Relative: 0.8 % (ref 0.0–3.0)
Eosinophils Absolute: 0.2 10*3/uL (ref 0.0–0.7)
Eosinophils Relative: 3.1 % (ref 0.0–5.0)
HCT: 39.8 % (ref 36.0–46.0)
Hemoglobin: 13.5 g/dL (ref 12.0–15.0)
Lymphocytes Relative: 32.3 % (ref 12.0–46.0)
Lymphs Abs: 2.3 10*3/uL (ref 0.7–4.0)
MCHC: 33.9 g/dL (ref 30.0–36.0)
MCV: 90.5 fl (ref 78.0–100.0)
Monocytes Absolute: 0.8 10*3/uL (ref 0.1–1.0)
Monocytes Relative: 10.9 % (ref 3.0–12.0)
Neutro Abs: 3.7 10*3/uL (ref 1.4–7.7)
Neutrophils Relative %: 52.9 % (ref 43.0–77.0)
Platelets: 282 10*3/uL (ref 150.0–400.0)
RBC: 4.4 Mil/uL (ref 3.87–5.11)
RDW: 12.8 % (ref 11.5–15.5)
WBC: 7 10*3/uL (ref 4.0–10.5)

## 2019-12-14 LAB — COMPREHENSIVE METABOLIC PANEL
ALT: 14 U/L (ref 0–35)
AST: 21 U/L (ref 0–37)
Albumin: 4.5 g/dL (ref 3.5–5.2)
Alkaline Phosphatase: 56 U/L (ref 39–117)
BUN: 17 mg/dL (ref 6–23)
CO2: 31 mEq/L (ref 19–32)
Calcium: 9.7 mg/dL (ref 8.4–10.5)
Chloride: 101 mEq/L (ref 96–112)
Creatinine, Ser: 0.98 mg/dL (ref 0.40–1.20)
GFR: 54.85 mL/min — ABNORMAL LOW (ref >60.00)
Glucose, Bld: 97 mg/dL (ref 70–99)
Potassium: 4.5 mEq/L (ref 3.5–5.1)
Sodium: 138 mEq/L (ref 135–145)
Total Bilirubin: 0.8 mg/dL (ref 0.2–1.2)
Total Protein: 6.6 g/dL (ref 6.0–8.3)

## 2019-12-14 LAB — TSH: TSH: 2.76 u[IU]/mL (ref 0.35–4.50)

## 2019-12-16 ENCOUNTER — Other Ambulatory Visit: Payer: Self-pay

## 2019-12-16 ENCOUNTER — Telehealth: Payer: Self-pay | Admitting: Family Medicine

## 2019-12-16 ENCOUNTER — Encounter: Payer: Self-pay | Admitting: Family Medicine

## 2019-12-16 ENCOUNTER — Ambulatory Visit (INDEPENDENT_AMBULATORY_CARE_PROVIDER_SITE_OTHER): Payer: Medicare HMO | Admitting: Family Medicine

## 2019-12-16 VITALS — BP 116/62 | HR 78 | Temp 97.7°F | Ht 58.75 in | Wt 108.6 lb

## 2019-12-16 DIAGNOSIS — E7800 Pure hypercholesterolemia, unspecified: Secondary | ICD-10-CM | POA: Diagnosis not present

## 2019-12-16 DIAGNOSIS — M8589 Other specified disorders of bone density and structure, multiple sites: Secondary | ICD-10-CM

## 2019-12-16 DIAGNOSIS — Z0000 Encounter for general adult medical examination without abnormal findings: Secondary | ICD-10-CM

## 2019-12-16 DIAGNOSIS — E559 Vitamin D deficiency, unspecified: Secondary | ICD-10-CM

## 2019-12-16 MED ORDER — ALENDRONATE SODIUM 70 MG PO TABS: 70.0000 mg | ORAL_TABLET | ORAL | 11 refills | Status: DC

## 2019-12-16 NOTE — Telephone Encounter (Signed)
Aware, thanks!

## 2019-12-16 NOTE — Telephone Encounter (Signed)
Pt forgot to let dr tower know Pt has left shoulder pain for 6 month.  I sent pt up with dr Lorelei Pont 3/11
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
## 2019-12-16 NOTE — Assessment & Plan Note (Signed)
Vitamin D level is therapeutic with current supplementation Disc importance of this to bone and overall health Level in 50s

## 2019-12-16 NOTE — Assessment & Plan Note (Signed)
Reviewed health habits including diet and exercise and skin cancer prevention Reviewed appropriate screening tests for age  Also reviewed health mt list, fam hx and immunization status , as well as social and family history   See HPI Labs reviewed Sent for mammogram report and shingrix vaccine report dexa reviewed  amw reviewed

## 2019-12-16 NOTE — Patient Instructions (Addendum)
We will send for your last mammogram report and shingrix vaccines   Keep taking good care of yourself   Re start alendronate weekly -for bone density and fracture prevention  If any side effects=please let us know and hold it   Labs look good

## 2019-12-16 NOTE — Assessment & Plan Note (Signed)
Disc goals for lipids and reasons to control them Rev last labs with pt Rev low sat fat diet in detail Diet controlled  HDL is high  Good ratio

## 2019-12-16 NOTE — Assessment & Plan Note (Signed)
dexa 2/21 reviewed -worse in L FN Pt is open to another 5 y course of alendronate Will start this-tolerated well in the past  No falls or fx  tx D level  Good exercise

## 2019-12-22 DIAGNOSIS — Z85828 Personal history of other malignant neoplasm of skin: Secondary | ICD-10-CM | POA: Diagnosis not present

## 2019-12-22 DIAGNOSIS — L814 Other melanin hyperpigmentation: Secondary | ICD-10-CM | POA: Diagnosis not present

## 2019-12-22 DIAGNOSIS — L820 Inflamed seborrheic keratosis: Secondary | ICD-10-CM | POA: Diagnosis not present

## 2019-12-22 DIAGNOSIS — L570 Actinic keratosis: Secondary | ICD-10-CM | POA: Diagnosis not present

## 2019-12-22 DIAGNOSIS — L578 Other skin changes due to chronic exposure to nonionizing radiation: Secondary | ICD-10-CM | POA: Diagnosis not present

## 2019-12-22 DIAGNOSIS — C44319 Basal cell carcinoma of skin of other parts of face: Secondary | ICD-10-CM | POA: Diagnosis not present

## 2019-12-22 DIAGNOSIS — D492 Neoplasm of unspecified behavior of bone, soft tissue, and skin: Secondary | ICD-10-CM | POA: Diagnosis not present

## 2019-12-22 NOTE — Progress Notes (Signed)
Christina Henry T. [name], Christina [facility] [place] [facility] [place], [address] Phone: [phone]  FAX: [phone]  Christina Henry - 79 y.o. female  MRN [id number]  Date of Birth: [birth year]  Visit Date: 12/24/2019  PCP: Abner Greenspan, Christina Henry  Referred by: Tower, Wynelle Fanny, Christina Henry  Chief Complaint  Patient presents with  . Shoulder Pain    Left    This visit occurred during the SARS-CoV-2 public health emergency.  Safety protocols were in place, including screening questions prior to the visit, additional usage of staff PPE, and extensive cleaning of exam room while observing appropriate contact time as indicated for disinfecting solutions.   Subjective:   This 79 y.o. female patient noted above presents with shoulder pain that has been ongoing for 4 mo there is no history of trauma or accident recently The patient denies neck pain or radicular symptoms. Denies dislocation, subluxation, separation of the shoulder. The patient does complain of pain in the overhead plane with significant painful arc of motion.  She presents with some ongoing shoulder pain.  I saw her 2-1/2 years ago with some rotator cuff tendinopathy.  Closing a heavy bin, and hurt a lot.  Continued to hurt.  Sometimes would wake up at night.  Has been ongoing for 4 or 5 months. Played golf on Saturday and without pain.  Played yesterday.  Did some rehab a few years ago.  R has been ok for several years.  Has been active for many years.   Medications Tried: Tylenol, NSAIDS Ice or Heat: minimally helpful Tried PT: Years prior for R rtc tend  Prior shoulder Injury: No Prior surgery: No Prior fracture: No   Review of Systems is noted in the HPI, as appropriate  Objective:   Blood pressure 118/62, pulse (!) 58, temperature 98.6 F (37 C), temperature source Temporal, height 4' 10.75" (1.492 m), weight 107 lb 4 oz (48.6 kg), SpO2 97 %.  GEN: No  acute distress; alert,appropriate. PULM: Breathing comfortably in no respiratory distress PSYCH: Normally interactive.    Shoulder: L Inspection: No muscle wasting or winging Ecchymosis/edema: neg  AC joint, scapula, clavicle: NT Cervical spine: NT, full ROM Spurling's: neg Abduction: full, 5/5 Flexion: full, 5/5 IR, full, lift-off: 5/5 ER at neutral: full, 5/5 AC crossover: neg Neer: minimally pos Hawkins: mildly pos Drop Test: neg Empty Can: neg Supraspinatus insertion: NT Bicipital groove: NT Speed's: neg Yergason's: neg Sulcus sign: neg Scapular dyskinesis: none C5-T1 intact  Neuro: Sensation intact Grip 5/5   Radiology: No results found.  Assessment and Plan:      ICD-10-CM   1. Impingement syndrome of left shoulder  M75.42     Level of Medical Decision-Making in this case is low  Rotator cuff strengthening and scapular stabilization exercises were reviewed with the patient.  [facility] RTC and scapular stabilization and shoulder protocol given to the patient. Retraining shoulder mechanics and function was emphasized to the patient with rehab done ideally 5-6 days a week.  Prn tylenol or ibuprofen for pain   Follow-up: No follow-ups on file.  No orders of the defined types were placed in this encounter.  No orders of the defined types were placed in this encounter.   Signed,  Maud Deed. [name], Christina Henry   Patient's Medications  New Prescriptions   No medications on file  Previous Medications   ALENDRONATE (FOSAMAX) 70 MG TABLET    Take 1 tablet (70  mg total) by mouth every 7 (seven) days. Take with a full glass of water on an empty stomach.   BIOTIN PO    Take 1 capsule by mouth daily.   CALCIUM CITRATE-VITAMIN D (CALCIUM + D PO)    Take 1 capsule by mouth daily.   CHOLECALCIFEROL (VITAMIN D PO)    Take 1 capsule by mouth daily.  Modified Medications   No medications on file  Discontinued Medications   No medications on file

## 2019-12-24 ENCOUNTER — Ambulatory Visit (INDEPENDENT_AMBULATORY_CARE_PROVIDER_SITE_OTHER): Payer: Medicare HMO | Admitting: Family Medicine

## 2019-12-24 ENCOUNTER — Other Ambulatory Visit: Payer: Self-pay

## 2019-12-24 ENCOUNTER — Encounter: Payer: Self-pay | Admitting: Family Medicine

## 2019-12-24 VITALS — BP 118/62 | HR 58 | Temp 98.6°F | Ht 58.75 in | Wt 107.2 lb

## 2019-12-24 DIAGNOSIS — M7542 Impingement syndrome of left shoulder: Secondary | ICD-10-CM

## 2020-01-06 ENCOUNTER — Ambulatory Visit: Payer: Medicare HMO | Attending: Internal Medicine

## 2020-01-06 DIAGNOSIS — Z23 Encounter for immunization: Secondary | ICD-10-CM

## 2020-01-06 NOTE — Progress Notes (Signed)
   Covid-19 Vaccination Clinic  Name:  Christina Henry    MRN: [id number] DOB: 05-04-1941  01/06/2020  [name] was observed post Covid-19 immunization for 15 minutes without incident. She was provided with Vaccine Information Sheet and instruction to access the V-Safe system.   [name] was instructed to call 911 with any severe reactions post vaccine: Marland Kitchen Difficulty breathing  . Swelling of face and throat  . A fast heartbeat  . A bad rash all over body  . Dizziness and weakness   Immunizations Administered    Name Date Dose VIS Date Route   Pfizer COVID-19 Vaccine 01/06/2020  9:53 AM 0.3 mL 09/25/2019 Intramuscular   Manufacturer: [place]   Lot: [id number]   [place]: [id number]

## 2020-02-01 DIAGNOSIS — Z85828 Personal history of other malignant neoplasm of skin: Secondary | ICD-10-CM | POA: Insufficient documentation

## 2020-02-01 DIAGNOSIS — Z6822 Body mass index (BMI) 22.0-22.9, adult: Secondary | ICD-10-CM | POA: Diagnosis not present

## 2020-02-01 DIAGNOSIS — C649 Malignant neoplasm of unspecified kidney, except renal pelvis: Secondary | ICD-10-CM | POA: Insufficient documentation

## 2020-02-01 DIAGNOSIS — Z01419 Encounter for gynecological examination (general) (routine) without abnormal findings: Secondary | ICD-10-CM | POA: Diagnosis not present

## 2020-02-01 DIAGNOSIS — N819 Female genital prolapse, unspecified: Secondary | ICD-10-CM | POA: Diagnosis not present

## 2020-02-01 DIAGNOSIS — C44310 Basal cell carcinoma of skin of unspecified parts of face: Secondary | ICD-10-CM | POA: Insufficient documentation

## 2020-02-01 DIAGNOSIS — Z85528 Personal history of other malignant neoplasm of kidney: Secondary | ICD-10-CM | POA: Insufficient documentation

## 2020-02-01 DIAGNOSIS — N952 Postmenopausal atrophic vaginitis: Secondary | ICD-10-CM | POA: Diagnosis not present

## 2020-02-04 DIAGNOSIS — R69 Illness, unspecified: Secondary | ICD-10-CM | POA: Diagnosis not present

## 2020-02-10 DIAGNOSIS — L578 Other skin changes due to chronic exposure to nonionizing radiation: Secondary | ICD-10-CM | POA: Diagnosis not present

## 2020-02-10 DIAGNOSIS — L814 Other melanin hyperpigmentation: Secondary | ICD-10-CM | POA: Diagnosis not present

## 2020-02-10 DIAGNOSIS — C44319 Basal cell carcinoma of skin of other parts of face: Secondary | ICD-10-CM | POA: Diagnosis not present

## 2020-02-10 DIAGNOSIS — Z85828 Personal history of other malignant neoplasm of skin: Secondary | ICD-10-CM | POA: Diagnosis not present

## 2020-02-10 DIAGNOSIS — L821 Other seborrheic keratosis: Secondary | ICD-10-CM | POA: Diagnosis not present

## 2020-02-10 DIAGNOSIS — D485 Neoplasm of uncertain behavior of skin: Secondary | ICD-10-CM | POA: Diagnosis not present

## 2020-03-07 ENCOUNTER — Encounter: Payer: Self-pay | Admitting: Family Medicine

## 2020-03-07 DIAGNOSIS — H25041 Posterior subcapsular polar age-related cataract, right eye: Secondary | ICD-10-CM | POA: Diagnosis not present

## 2020-03-07 DIAGNOSIS — H35362 Drusen (degenerative) of macula, left eye: Secondary | ICD-10-CM | POA: Diagnosis not present

## 2020-03-07 DIAGNOSIS — H2513 Age-related nuclear cataract, bilateral: Secondary | ICD-10-CM | POA: Diagnosis not present

## 2020-03-07 DIAGNOSIS — H25013 Cortical age-related cataract, bilateral: Secondary | ICD-10-CM | POA: Diagnosis not present

## 2020-03-07 DIAGNOSIS — H35033 Hypertensive retinopathy, bilateral: Secondary | ICD-10-CM | POA: Diagnosis not present

## 2020-03-07 DIAGNOSIS — H2511 Age-related nuclear cataract, right eye: Secondary | ICD-10-CM | POA: Diagnosis not present

## 2020-03-08 DIAGNOSIS — D492 Neoplasm of unspecified behavior of bone, soft tissue, and skin: Secondary | ICD-10-CM | POA: Diagnosis not present

## 2020-03-08 DIAGNOSIS — C44629 Squamous cell carcinoma of skin of left upper limb, including shoulder: Secondary | ICD-10-CM | POA: Diagnosis not present

## 2020-03-08 DIAGNOSIS — L578 Other skin changes due to chronic exposure to nonionizing radiation: Secondary | ICD-10-CM | POA: Diagnosis not present

## 2020-03-08 DIAGNOSIS — L814 Other melanin hyperpigmentation: Secondary | ICD-10-CM | POA: Diagnosis not present

## 2020-03-08 DIAGNOSIS — Z85828 Personal history of other malignant neoplasm of skin: Secondary | ICD-10-CM | POA: Diagnosis not present

## 2020-03-08 DIAGNOSIS — L820 Inflamed seborrheic keratosis: Secondary | ICD-10-CM | POA: Diagnosis not present

## 2020-03-08 DIAGNOSIS — L821 Other seborrheic keratosis: Secondary | ICD-10-CM | POA: Diagnosis not present

## 2020-03-08 DIAGNOSIS — L570 Actinic keratosis: Secondary | ICD-10-CM | POA: Diagnosis not present

## 2020-03-22 DIAGNOSIS — H25811 Combined forms of age-related cataract, right eye: Secondary | ICD-10-CM | POA: Diagnosis not present

## 2020-03-22 DIAGNOSIS — H2511 Age-related nuclear cataract, right eye: Secondary | ICD-10-CM | POA: Diagnosis not present

## 2020-03-30 DIAGNOSIS — L820 Inflamed seborrheic keratosis: Secondary | ICD-10-CM | POA: Diagnosis not present

## 2020-03-30 DIAGNOSIS — C44629 Squamous cell carcinoma of skin of left upper limb, including shoulder: Secondary | ICD-10-CM | POA: Diagnosis not present

## 2020-03-30 DIAGNOSIS — C4492 Squamous cell carcinoma of skin, unspecified: Secondary | ICD-10-CM | POA: Diagnosis not present

## 2020-03-30 DIAGNOSIS — L570 Actinic keratosis: Secondary | ICD-10-CM | POA: Diagnosis not present

## 2020-05-30 DIAGNOSIS — N819 Female genital prolapse, unspecified: Secondary | ICD-10-CM | POA: Diagnosis not present

## 2020-06-27 ENCOUNTER — Encounter: Payer: Self-pay | Admitting: Family Medicine

## 2020-06-27 DIAGNOSIS — H35033 Hypertensive retinopathy, bilateral: Secondary | ICD-10-CM | POA: Diagnosis not present

## 2020-06-27 DIAGNOSIS — H35362 Drusen (degenerative) of macula, left eye: Secondary | ICD-10-CM | POA: Diagnosis not present

## 2020-06-27 DIAGNOSIS — H2512 Age-related nuclear cataract, left eye: Secondary | ICD-10-CM | POA: Diagnosis not present

## 2020-06-27 DIAGNOSIS — H25012 Cortical age-related cataract, left eye: Secondary | ICD-10-CM | POA: Diagnosis not present

## 2020-07-05 DIAGNOSIS — H25012 Cortical age-related cataract, left eye: Secondary | ICD-10-CM | POA: Diagnosis not present

## 2020-07-05 DIAGNOSIS — H25812 Combined forms of age-related cataract, left eye: Secondary | ICD-10-CM | POA: Diagnosis not present

## 2020-07-05 DIAGNOSIS — H2512 Age-related nuclear cataract, left eye: Secondary | ICD-10-CM | POA: Diagnosis not present

## 2020-07-18 DIAGNOSIS — M810 Age-related osteoporosis without current pathological fracture: Secondary | ICD-10-CM | POA: Diagnosis not present

## 2020-07-18 DIAGNOSIS — R32 Unspecified urinary incontinence: Secondary | ICD-10-CM | POA: Diagnosis not present

## 2020-07-18 DIAGNOSIS — Z809 Family history of malignant neoplasm, unspecified: Secondary | ICD-10-CM | POA: Diagnosis not present

## 2020-07-18 DIAGNOSIS — Z85528 Personal history of other malignant neoplasm of kidney: Secondary | ICD-10-CM | POA: Diagnosis not present

## 2020-07-18 DIAGNOSIS — Z8249 Family history of ischemic heart disease and other diseases of the circulatory system: Secondary | ICD-10-CM | POA: Diagnosis not present

## 2020-07-18 DIAGNOSIS — R030 Elevated blood-pressure reading, without diagnosis of hypertension: Secondary | ICD-10-CM | POA: Diagnosis not present

## 2020-07-18 DIAGNOSIS — Z85828 Personal history of other malignant neoplasm of skin: Secondary | ICD-10-CM | POA: Diagnosis not present

## 2020-07-18 DIAGNOSIS — Z7983 Long term (current) use of bisphosphonates: Secondary | ICD-10-CM | POA: Diagnosis not present

## 2020-07-28 DIAGNOSIS — R69 Illness, unspecified: Secondary | ICD-10-CM | POA: Diagnosis not present

## 2020-08-11 DIAGNOSIS — R69 Illness, unspecified: Secondary | ICD-10-CM | POA: Diagnosis not present

## 2020-08-18 DIAGNOSIS — H02413 Mechanical ptosis of bilateral eyelids: Secondary | ICD-10-CM | POA: Diagnosis not present

## 2020-08-18 DIAGNOSIS — H57813 Brow ptosis, bilateral: Secondary | ICD-10-CM | POA: Diagnosis not present

## 2020-08-18 DIAGNOSIS — H02834 Dermatochalasis of left upper eyelid: Secondary | ICD-10-CM | POA: Diagnosis not present

## 2020-08-18 DIAGNOSIS — H0279 Other degenerative disorders of eyelid and periocular area: Secondary | ICD-10-CM | POA: Diagnosis not present

## 2020-08-18 DIAGNOSIS — H02831 Dermatochalasis of right upper eyelid: Secondary | ICD-10-CM | POA: Diagnosis not present

## 2020-08-18 DIAGNOSIS — D485 Neoplasm of uncertain behavior of skin: Secondary | ICD-10-CM | POA: Diagnosis not present

## 2020-08-18 DIAGNOSIS — H53483 Generalized contraction of visual field, bilateral: Secondary | ICD-10-CM | POA: Diagnosis not present

## 2020-08-24 DIAGNOSIS — H53483 Generalized contraction of visual field, bilateral: Secondary | ICD-10-CM | POA: Diagnosis not present

## 2020-10-18 DIAGNOSIS — D229 Melanocytic nevi, unspecified: Secondary | ICD-10-CM | POA: Diagnosis not present

## 2020-10-18 DIAGNOSIS — L570 Actinic keratosis: Secondary | ICD-10-CM | POA: Diagnosis not present

## 2020-10-18 DIAGNOSIS — L814 Other melanin hyperpigmentation: Secondary | ICD-10-CM | POA: Diagnosis not present

## 2020-10-18 DIAGNOSIS — Z85828 Personal history of other malignant neoplasm of skin: Secondary | ICD-10-CM | POA: Diagnosis not present

## 2020-10-18 DIAGNOSIS — L820 Inflamed seborrheic keratosis: Secondary | ICD-10-CM | POA: Diagnosis not present

## 2020-11-07 ENCOUNTER — Other Ambulatory Visit: Payer: Self-pay | Admitting: Ophthalmology

## 2020-11-07 DIAGNOSIS — H53453 Other localized visual field defect, bilateral: Secondary | ICD-10-CM | POA: Diagnosis not present

## 2020-11-07 DIAGNOSIS — D485 Neoplasm of uncertain behavior of skin: Secondary | ICD-10-CM | POA: Diagnosis not present

## 2020-11-07 DIAGNOSIS — H02831 Dermatochalasis of right upper eyelid: Secondary | ICD-10-CM | POA: Diagnosis not present

## 2020-11-07 DIAGNOSIS — H02834 Dermatochalasis of left upper eyelid: Secondary | ICD-10-CM | POA: Diagnosis not present

## 2020-11-07 DIAGNOSIS — L821 Other seborrheic keratosis: Secondary | ICD-10-CM | POA: Diagnosis not present

## 2020-11-12 ENCOUNTER — Other Ambulatory Visit: Payer: Self-pay | Admitting: Family Medicine

## 2020-12-01 DIAGNOSIS — N819 Female genital prolapse, unspecified: Secondary | ICD-10-CM | POA: Diagnosis not present

## 2020-12-01 DIAGNOSIS — N952 Postmenopausal atrophic vaginitis: Secondary | ICD-10-CM | POA: Diagnosis not present

## 2020-12-05 ENCOUNTER — Encounter: Payer: Self-pay | Admitting: Family Medicine

## 2020-12-05 DIAGNOSIS — Z1231 Encounter for screening mammogram for malignant neoplasm of breast: Secondary | ICD-10-CM | POA: Diagnosis not present

## 2020-12-15 ENCOUNTER — Telehealth: Payer: Self-pay | Admitting: Family Medicine

## 2020-12-15 DIAGNOSIS — E559 Vitamin D deficiency, unspecified: Secondary | ICD-10-CM

## 2020-12-15 DIAGNOSIS — E7800 Pure hypercholesterolemia, unspecified: Secondary | ICD-10-CM

## 2020-12-15 DIAGNOSIS — Z0000 Encounter for general adult medical examination without abnormal findings: Secondary | ICD-10-CM

## 2020-12-15 NOTE — Telephone Encounter (Signed)
-----   Message from Cloyd Stagers, RT sent at 11/28/2020  2:21 PM EST ----- Regarding: Lab Orders for Friday 3.4.2022 Please place lab orders for  Friday 3.4.2022, office visit for physical on Tuesday 3.15.2022 Thank you, Dyke Maes RT(R)

## 2020-12-16 ENCOUNTER — Other Ambulatory Visit: Payer: Self-pay

## 2020-12-16 ENCOUNTER — Telehealth: Payer: Self-pay

## 2020-12-16 ENCOUNTER — Other Ambulatory Visit (INDEPENDENT_AMBULATORY_CARE_PROVIDER_SITE_OTHER): Payer: Medicare HMO

## 2020-12-16 ENCOUNTER — Ambulatory Visit: Payer: Medicare HMO

## 2020-12-16 DIAGNOSIS — Z0000 Encounter for general adult medical examination without abnormal findings: Secondary | ICD-10-CM

## 2020-12-16 DIAGNOSIS — E559 Vitamin D deficiency, unspecified: Secondary | ICD-10-CM | POA: Diagnosis not present

## 2020-12-16 DIAGNOSIS — E7800 Pure hypercholesterolemia, unspecified: Secondary | ICD-10-CM

## 2020-12-16 LAB — LIPID PANEL
Cholesterol: 212 mg/dL — ABNORMAL HIGH (ref 0–200)
HDL: 87.8 mg/dL (ref >39.00)
LDL Cholesterol: 110 mg/dL — ABNORMAL HIGH (ref 0–99)
NonHDL: 124.07
Total CHOL/HDL Ratio: 2
Triglycerides: 69 mg/dL (ref 0.0–149.0)
VLDL: 13.8 mg/dL (ref 0.0–40.0)

## 2020-12-16 LAB — CBC WITH DIFFERENTIAL/PLATELET
Basophils Absolute: 0.1 10*3/uL (ref 0.0–0.1)
Basophils Relative: 0.8 % (ref 0.0–3.0)
Eosinophils Absolute: 0.7 10*3/uL (ref 0.0–0.7)
Eosinophils Relative: 7.6 % — ABNORMAL HIGH (ref 0.0–5.0)
HCT: 39.9 % (ref 36.0–46.0)
Hemoglobin: 13.4 g/dL (ref 12.0–15.0)
Lymphocytes Relative: 20.8 % (ref 12.0–46.0)
Lymphs Abs: 1.9 10*3/uL (ref 0.7–4.0)
MCHC: 33.5 g/dL (ref 30.0–36.0)
MCV: 91.4 fl (ref 78.0–100.0)
Monocytes Absolute: 0.9 10*3/uL (ref 0.1–1.0)
Monocytes Relative: 10.1 % (ref 3.0–12.0)
Neutro Abs: 5.6 10*3/uL (ref 1.4–7.7)
Neutrophils Relative %: 60.7 % (ref 43.0–77.0)
Platelets: 388 10*3/uL (ref 150.0–400.0)
RBC: 4.37 Mil/uL (ref 3.87–5.11)
RDW: 12.8 % (ref 11.5–15.5)
WBC: 9.3 10*3/uL (ref 4.0–10.5)

## 2020-12-16 LAB — COMPREHENSIVE METABOLIC PANEL
ALT: 11 U/L (ref 0–35)
AST: 16 U/L (ref 0–37)
Albumin: 4.3 g/dL (ref 3.5–5.2)
Alkaline Phosphatase: 57 U/L (ref 39–117)
BUN: 25 mg/dL — ABNORMAL HIGH (ref 6–23)
CO2: 28 mEq/L (ref 19–32)
Calcium: 9.7 mg/dL (ref 8.4–10.5)
Chloride: 100 mEq/L (ref 96–112)
Creatinine, Ser: 1.1 mg/dL (ref 0.40–1.20)
GFR: 47.87 mL/min — ABNORMAL LOW (ref >60.00)
Glucose, Bld: 96 mg/dL (ref 70–99)
Potassium: 4.1 mEq/L (ref 3.5–5.1)
Sodium: 138 mEq/L (ref 135–145)
Total Bilirubin: 0.8 mg/dL (ref 0.2–1.2)
Total Protein: 6.5 g/dL (ref 6.0–8.3)

## 2020-12-16 LAB — TSH: TSH: 2.8 u[IU]/mL (ref 0.35–4.50)

## 2020-12-16 LAB — VITAMIN D 25 HYDROXY (VIT D DEFICIENCY, FRACTURES): VITD: 44.2 ng/mL (ref 30.00–100.00)

## 2020-12-16 NOTE — Telephone Encounter (Signed)
Called patient 3 times trying to complete her AWV. Kept going straight to voicemail. Left message on voicemail notifying patient she can call and reschedule or provider may complete at her upcoming physical. Appointment was cancelled.

## 2020-12-21 ENCOUNTER — Encounter: Payer: Medicare HMO | Admitting: Family Medicine

## 2020-12-27 ENCOUNTER — Encounter: Payer: Medicare HMO | Admitting: Family Medicine

## 2021-01-24 ENCOUNTER — Ambulatory Visit (INDEPENDENT_AMBULATORY_CARE_PROVIDER_SITE_OTHER): Payer: Medicare HMO | Admitting: Family Medicine

## 2021-01-24 ENCOUNTER — Other Ambulatory Visit: Payer: Self-pay

## 2021-01-24 ENCOUNTER — Encounter: Payer: Self-pay | Admitting: Family Medicine

## 2021-01-24 VITALS — BP 126/78 | HR 65 | Temp 96.9°F | Ht 58.75 in | Wt 109.4 lb

## 2021-01-24 DIAGNOSIS — M8589 Other specified disorders of bone density and structure, multiple sites: Secondary | ICD-10-CM | POA: Diagnosis not present

## 2021-01-24 DIAGNOSIS — Z0000 Encounter for general adult medical examination without abnormal findings: Secondary | ICD-10-CM

## 2021-01-24 DIAGNOSIS — L600 Ingrowing nail: Secondary | ICD-10-CM | POA: Insufficient documentation

## 2021-01-24 DIAGNOSIS — E7800 Pure hypercholesterolemia, unspecified: Secondary | ICD-10-CM | POA: Diagnosis not present

## 2021-01-24 DIAGNOSIS — E559 Vitamin D deficiency, unspecified: Secondary | ICD-10-CM

## 2021-01-24 NOTE — Assessment & Plan Note (Signed)
Reviewed health habits including diet and exercise and skin cancer prevention Reviewed appropriate screening tests for age  Also reviewed health mt list, fam hx and immunization status , as well as social and family history   See HPI Labs reviewed  utd breast and colon cancer screen utd gyn care  covid immnized  dexa utd and no falls or fractures  Good health habits Adv directive is utd No cognitive concerns Noted dec hearing in R ear- unsure if this is due to curved canal and machine mis match , does not desire further eval utd eye/vision care  Sees dermatology regularly for skin cancer screening

## 2021-01-24 NOTE — Assessment & Plan Note (Signed)
Ref to podiatry made at pt req

## 2021-01-24 NOTE — Assessment & Plan Note (Signed)
Level of 44.2 Vitamin D level is therapeutic with current supplementation Disc importance of this to bone and overall health

## 2021-01-24 NOTE — Assessment & Plan Note (Signed)
Disc goals for lipids and reasons to control them Rev last labs with pt Rev low sat fat diet in detail LDL 110 with high HDL

## 2021-01-24 NOTE — Assessment & Plan Note (Signed)
dexa 2/21 Taking alendronate weekly  No falls or fractures Taking ca and D tx D level and good exercise

## 2021-01-24 NOTE — Patient Instructions (Signed)
Drink lots of water  Take care of of yourself  Stay active   I placed a referral to podiatry- you will get a call
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
## 2021-02-01 DIAGNOSIS — N819 Female genital prolapse, unspecified: Secondary | ICD-10-CM | POA: Diagnosis not present

## 2021-02-01 DIAGNOSIS — N952 Postmenopausal atrophic vaginitis: Secondary | ICD-10-CM | POA: Diagnosis not present

## 2021-02-01 DIAGNOSIS — Z01419 Encounter for gynecological examination (general) (routine) without abnormal findings: Secondary | ICD-10-CM | POA: Diagnosis not present

## 2021-02-06 ENCOUNTER — Encounter: Payer: Self-pay | Admitting: Podiatry

## 2021-02-06 ENCOUNTER — Other Ambulatory Visit: Payer: Self-pay

## 2021-02-06 ENCOUNTER — Ambulatory Visit: Payer: Medicare HMO | Admitting: Podiatry

## 2021-02-06 DIAGNOSIS — L600 Ingrowing nail: Secondary | ICD-10-CM

## 2021-02-06 MED ORDER — NEOMYCIN-POLYMYXIN-HC 1 % OT SOLN: OTIC | 1 refills | Status: DC
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
## 2021-02-06 NOTE — Patient Instructions (Signed)

## 2021-02-14 ENCOUNTER — Ambulatory Visit (INDEPENDENT_AMBULATORY_CARE_PROVIDER_SITE_OTHER): Payer: Medicare HMO | Admitting: Family Medicine

## 2021-02-14 ENCOUNTER — Encounter: Payer: Self-pay | Admitting: Family Medicine

## 2021-02-14 ENCOUNTER — Other Ambulatory Visit: Payer: Self-pay

## 2021-02-14 DIAGNOSIS — R9389 Abnormal findings on diagnostic imaging of other specified body structures: Secondary | ICD-10-CM | POA: Insufficient documentation

## 2021-02-14 NOTE — Assessment & Plan Note (Signed)
Dentist noted a radio opacity on left side around C2 level  Area of carotid No symptoms  No h/o TIA or CVA H/o elevated cholesterol  Carotid US ordered

## 2021-02-14 NOTE — Patient Instructions (Signed)
Take care of yourself   The office will call regarding a carotid ultrasound
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
## 2021-02-15 ENCOUNTER — Telehealth: Payer: Self-pay

## 2021-02-15 NOTE — Telephone Encounter (Signed)
Left message for patient to discuss Carotid Ultrasound scheduled for 02/17/21 at 3 pm (otherwise it will be in June unless she would like to go to [place])  This will be done at [facility] office located inside of [place] through medical mall entrance.

## 2021-02-15 NOTE — Telephone Encounter (Signed)
Patient advised.

## 2021-02-17 ENCOUNTER — Other Ambulatory Visit: Payer: Self-pay

## 2021-02-17 ENCOUNTER — Ambulatory Visit (INDEPENDENT_AMBULATORY_CARE_PROVIDER_SITE_OTHER): Payer: Medicare HMO

## 2021-02-17 DIAGNOSIS — R9389 Abnormal findings on diagnostic imaging of other specified body structures: Secondary | ICD-10-CM | POA: Diagnosis not present

## 2021-02-22 ENCOUNTER — Other Ambulatory Visit: Payer: Self-pay

## 2021-02-22 ENCOUNTER — Ambulatory Visit: Payer: Medicare HMO | Admitting: Podiatry

## 2021-02-22 ENCOUNTER — Encounter: Payer: Self-pay | Admitting: Podiatry

## 2021-02-22 DIAGNOSIS — L600 Ingrowing nail: Secondary | ICD-10-CM

## 2021-02-22 DIAGNOSIS — Z9889 Other specified postprocedural states: Secondary | ICD-10-CM

## 2021-02-22 NOTE — Progress Notes (Signed)
She presents today for follow-up of her matrixectomy fourth toe left foot.  She states that it is a little bit tender but is doing better now.  She states that the Betadine was bothering her considerably causing her to itch and have a rash.  She is also complaining of pain to the toe itself.  States that she still applies some Neosporin and is switched over to Epson salts.  Objective: Vital signs are stable alert and oriented x3.  Pulses are palpable.  Fourth toe left foot demonstrates some mild erythema though he does have an eschar with some tissue destruction where the phenol was applied.  This looks like is going going to heal uneventfully and currently there is no signs of infection.  Assessment: Well-healing surgical toe fourth tibial border.  Plan: Encouraged her to continue to soak every other day Epson salts and warm water continue the antibiotic cream cover during the day leave open at bedtime.

## 2021-04-07 DIAGNOSIS — L570 Actinic keratosis: Secondary | ICD-10-CM | POA: Diagnosis not present

## 2021-04-07 DIAGNOSIS — C44329 Squamous cell carcinoma of skin of other parts of face: Secondary | ICD-10-CM | POA: Diagnosis not present

## 2021-04-07 DIAGNOSIS — D492 Neoplasm of unspecified behavior of bone, soft tissue, and skin: Secondary | ICD-10-CM | POA: Diagnosis not present

## 2021-04-13 DIAGNOSIS — Z961 Presence of intraocular lens: Secondary | ICD-10-CM | POA: Diagnosis not present

## 2021-04-13 DIAGNOSIS — H5211 Myopia, right eye: Secondary | ICD-10-CM | POA: Diagnosis not present

## 2021-04-13 DIAGNOSIS — H524 Presbyopia: Secondary | ICD-10-CM | POA: Diagnosis not present

## 2021-05-02 DIAGNOSIS — L814 Other melanin hyperpigmentation: Secondary | ICD-10-CM | POA: Diagnosis not present

## 2021-05-02 DIAGNOSIS — L578 Other skin changes due to chronic exposure to nonionizing radiation: Secondary | ICD-10-CM | POA: Diagnosis not present

## 2021-05-02 DIAGNOSIS — I781 Nevus, non-neoplastic: Secondary | ICD-10-CM | POA: Diagnosis not present

## 2021-05-02 DIAGNOSIS — C44329 Squamous cell carcinoma of skin of other parts of face: Secondary | ICD-10-CM | POA: Diagnosis not present

## 2021-05-02 DIAGNOSIS — L988 Other specified disorders of the skin and subcutaneous tissue: Secondary | ICD-10-CM | POA: Diagnosis not present

## 2021-05-09 DIAGNOSIS — R32 Unspecified urinary incontinence: Secondary | ICD-10-CM | POA: Diagnosis not present

## 2021-05-09 DIAGNOSIS — Z85528 Personal history of other malignant neoplasm of kidney: Secondary | ICD-10-CM | POA: Diagnosis not present

## 2021-05-09 DIAGNOSIS — R69 Illness, unspecified: Secondary | ICD-10-CM | POA: Diagnosis not present

## 2021-05-09 DIAGNOSIS — M810 Age-related osteoporosis without current pathological fracture: Secondary | ICD-10-CM | POA: Diagnosis not present

## 2021-05-09 DIAGNOSIS — Z809 Family history of malignant neoplasm, unspecified: Secondary | ICD-10-CM | POA: Diagnosis not present

## 2021-05-09 DIAGNOSIS — Z7983 Long term (current) use of bisphosphonates: Secondary | ICD-10-CM | POA: Diagnosis not present

## 2021-05-09 DIAGNOSIS — Z85828 Personal history of other malignant neoplasm of skin: Secondary | ICD-10-CM | POA: Diagnosis not present

## 2021-05-09 DIAGNOSIS — Z8349 Family history of other endocrine, nutritional and metabolic diseases: Secondary | ICD-10-CM | POA: Diagnosis not present

## 2021-05-09 DIAGNOSIS — Z8249 Family history of ischemic heart disease and other diseases of the circulatory system: Secondary | ICD-10-CM | POA: Diagnosis not present

## 2021-08-25 ENCOUNTER — Ambulatory Visit (INDEPENDENT_AMBULATORY_CARE_PROVIDER_SITE_OTHER)
Admission: RE | Admit: 2021-08-25 | Discharge: 2021-08-25 | Disposition: A | Discharge status: Home / Self Care | Payer: Medicare HMO | Source: Ambulatory Visit | Attending: Family Medicine | Admitting: Family Medicine

## 2021-08-25 ENCOUNTER — Other Ambulatory Visit: Payer: Self-pay

## 2021-08-25 ENCOUNTER — Encounter: Payer: Self-pay | Admitting: Family Medicine

## 2021-08-25 ENCOUNTER — Ambulatory Visit (INDEPENDENT_AMBULATORY_CARE_PROVIDER_SITE_OTHER): Payer: Medicare HMO | Admitting: Family Medicine

## 2021-08-25 DIAGNOSIS — J209 Acute bronchitis, unspecified: Secondary | ICD-10-CM | POA: Insufficient documentation

## 2021-08-25 DIAGNOSIS — R058 Other specified cough: Secondary | ICD-10-CM

## 2021-08-25 DIAGNOSIS — R059 Cough, unspecified: Secondary | ICD-10-CM | POA: Diagnosis not present

## 2021-08-25 MED ORDER — DOXYCYCLINE HYCLATE 100 MG PO TABS
100.0000 mg | ORAL_TABLET | Freq: Two times a day (BID) | ORAL | 0 refills | Status: DC
Start: 2021-08-25 — End: 2021-11-01
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
## 2021-08-25 NOTE — Patient Instructions (Signed)
Drink fluids  Mucinex is helpful to loosen phlegm   If wheezy or tight chest let me know   Take the doxycycline as directed   Chest xray now   Update if not starting to improve in a week or if worsening  If any sudden/severe symptoms go to the ER

## 2021-08-27 NOTE — Assessment & Plan Note (Signed)
3-4 weeks of chest congestion and cough Now some chest soreness with deep breath and scant rhonchi Doxycycline px  Will hold off on steroids since no wheeze cxr planned -pending reading  ER parameters discussed  Update if not starting to improve in a week or if worsening

## 2021-09-01 ENCOUNTER — Telehealth: Payer: Self-pay | Admitting: Family Medicine

## 2021-09-01 NOTE — Telephone Encounter (Signed)
It does not matter, as long as she feels ok and does not have a fever

## 2021-09-01 NOTE — Telephone Encounter (Addendum)
Pt called in wanting to know how long should she wait before she gets her flu shot if she is taking doxycycline. Pt request call back

## 2021-09-01 NOTE — Telephone Encounter (Signed)
Pt.notified

## 2021-10-17 ENCOUNTER — Other Ambulatory Visit: Payer: Self-pay | Admitting: Family Medicine

## 2021-11-01 ENCOUNTER — Ambulatory Visit (INDEPENDENT_AMBULATORY_CARE_PROVIDER_SITE_OTHER): Payer: Medicare HMO | Admitting: Family Medicine

## 2021-11-01 ENCOUNTER — Encounter: Payer: Self-pay | Admitting: Family Medicine

## 2021-11-01 ENCOUNTER — Other Ambulatory Visit: Payer: Self-pay

## 2021-11-01 VITALS — BP 128/72 | HR 75 | Temp 98.0°F | Ht 58.75 in | Wt 110.5 lb

## 2021-11-01 DIAGNOSIS — J209 Acute bronchitis, unspecified: Secondary | ICD-10-CM

## 2021-11-01 LAB — POC COVID19 BINAXNOW: SARS Coronavirus 2 Ag: NEGATIVE

## 2021-11-01 MED ORDER — BENZONATATE 200 MG PO CAPS: 200.0000 mg | ORAL_CAPSULE | Freq: Three times a day (TID) | ORAL | 1 refills | Status: DC | PRN

## 2021-11-01 MED ORDER — ALBUTEROL SULFATE HFA 108 (90 BASE) MCG/ACT IN AERS: 2.0000 | INHALATION_SPRAY | RESPIRATORY_TRACT | 1 refills | Status: DC | PRN

## 2021-11-01 MED ORDER — PREDNISONE 10 MG PO TABS: ORAL_TABLET | ORAL | 0 refills | Status: DC

## 2021-11-01 NOTE — Patient Instructions (Addendum)
Try mucinex dm for cough  (or mucinex and delsym) Drink fluids  Try the tessalon pills for cough  Take prednisone as directed Try the inhaler for wheeze or tight chest   Update if not starting to improve in a week or if worsening   If worse/severe- go to the ER

## 2021-11-01 NOTE — Assessment & Plan Note (Signed)
Acute on chronic (still mild cough from November) Rhonchi and wheeze on exam Pt is resistant to prednisone but agrees at low dose  Albuterol inhaler with education given Tessalon for cough Can use otc mucinex dm  Update if not starting to improve in a week or if worsening  ER precautions discussed Low threshold for CXR if needed

## 2021-11-01 NOTE — Progress Notes (Signed)
Subjective:    Patient ID: Christina Henry, female    DOB: [birth year], 81 y.o.   MRN: [id number]  This visit occurred during the SARS-CoV-2 public health emergency.  Safety protocols were in place, including screening questions prior to the visit, additional usage of staff PPE, and extensive cleaning of exam room while observing appropriate contact time as indicated for disinfecting solutions.   HPI Pt presents for c/o cough since November  Wt Readings from Last 3 Encounters:  11/01/21 110 lb 8 oz (50.1 kg)  08/25/21 109 lb 4 oz (49.6 kg)  02/14/21 110 lb 9 oz (50.2 kg)   22.51 kg/m  Pulse ox is 96% on RA today  She has never smoked   She was seen in November here for cough and chest congestion (week 3 at that time) Was dx with bronchitis and px doxycycline Cxr at that time confirmed signs of bronchitis :  DG Chest 2 View (Accession [id number]) [id number] [id number]) Imaging Date: 08/25/2021 Department: [place] Released By: Ellamae Sia Authorizing: Ariadna Setter, Wynelle Fanny, MD   Exam Status  Status  Final [99]   PACS Intelerad Image Link   Show images for DG Chest 2 View  Study Result  Narrative & Impression  CLINICAL DATA:  A 81 year old female presents with productive cough of 3-4 weeks duration.   EXAM: CHEST - 2 VIEW   COMPARISON:  Comparison made with May of 2017.   FINDINGS: Trachea is midline. Cardiomediastinal contours and hilar structures are normal.   Mild central airway thickening/question of bronchial wall thickening. No sign of lobar consolidative process. No visible pneumothorax. No pleural effusion.   On limited assessment there is no acute skeletal process.   IMPRESSION: Question mild bronchial thickening, perhaps related to bronchitis. No lobar consolidative process or pleural effusion.     Electronically Signed   By: Zetta Bills M.D.   On: 08/25/2021 15:38      Regular meds include alendronate and ca/D/  zinc  She got better but continued a little cough   Then this weekend -worse again  Can't stop coughing-until her body is sore Has to sleep in lounge chair Now congestion in sinuses and yellow mucous Phlegm from chest is white    A little dizzy on Monday-better now   Little wheezing when she mouth breathes   Had a headache early on /better now  Sinus pressure without pain  No ST  Ears are ok   No n/v/d   No fever  No chills or body aches (just sore from coughing)  Has not done a covid test    No particular sick contacts   Otc : mucinex  Results for orders placed or performed in visit on 11/01/21  POC COVID-19 BinaxNow  Result Value Ref Range   SARS Coronavirus 2 Ag Negative Negative      Review of Systems  Constitutional:  Positive for fatigue. Negative for activity change, appetite change, fever and unexpected weight change.  HENT:  Positive for congestion, postnasal drip, rhinorrhea and sinus pressure. Negative for ear pain, sinus pain and sore throat.   Eyes:  Negative for pain, redness and visual disturbance.  Respiratory:  Positive for cough and wheezing. Negative for shortness of breath and stridor.   Cardiovascular:  Negative for chest pain and palpitations.  Gastrointestinal:  Negative for abdominal pain, blood in stool, constipation and diarrhea.  Endocrine: Negative for polydipsia and polyuria.  Genitourinary:  Negative for dysuria, frequency and urgency.  Musculoskeletal:  Negative for arthralgias, back pain and myalgias.  Skin:  Negative for pallor and rash.  Allergic/Immunologic: Negative for environmental allergies.  Neurological:  Negative for dizziness, syncope and headaches.  Hematological:  Negative for adenopathy. Does not bruise/bleed easily.  Psychiatric/Behavioral:  Negative for decreased concentration and dysphoric mood. The patient is not nervous/anxious.       Objective:   Physical Exam Constitutional:      General: She is not in acute  distress.    Appearance: Normal appearance. She is well-developed and normal weight. She is not ill-appearing, toxic-appearing or diaphoretic.  HENT:     Head: Normocephalic and atraumatic.     Comments: Nares are injected and congested      Right Ear: Tympanic membrane, ear canal and external ear normal.     Left Ear: Tympanic membrane, ear canal and external ear normal.     Nose: Congestion and rhinorrhea present.     Mouth/Throat:     Mouth: Mucous membranes are moist.     Pharynx: Oropharynx is clear. No oropharyngeal exudate or posterior oropharyngeal erythema.     Comments: Clear pnd  Eyes:     General: No scleral icterus.       Right eye: No discharge.        Left eye: No discharge.     Conjunctiva/sclera: Conjunctivae normal.     Pupils: Pupils are equal, round, and reactive to light.  Cardiovascular:     Rate and Rhythm: Normal rate.     Heart sounds: Normal heart sounds.  Pulmonary:     Effort: Pulmonary effort is normal. No respiratory distress.     Breath sounds: No stridor. Wheezing and rhonchi present. No rales.     Comments: Scant wheeze on forced exp only  Diffuse rhonchi  No prolonged expiration  No rales or crackles    Chest:     Chest wall: No tenderness.  Musculoskeletal:     Cervical back: Normal range of motion and neck supple.  Lymphadenopathy:     Cervical: No cervical adenopathy.  Skin:    General: Skin is warm and dry.     Capillary Refill: Capillary refill takes less than 2 seconds.     Findings: No rash.  Neurological:     Mental Status: She is alert.     Cranial Nerves: No cranial nerve deficit.  Psychiatric:        Mood and Affect: Mood normal.          Assessment & Plan:   Problem List Items Addressed This Visit       Respiratory   Acute bronchitis - Primary    Acute on chronic (still mild cough from November) Rhonchi and wheeze on exam Pt is resistant to prednisone but agrees at low dose  Albuterol inhaler with education  given Tessalon for cough Can use otc mucinex dm  Update if not starting to improve in a week or if worsening  ER precautions discussed Low threshold for CXR if needed       Relevant Orders   POC COVID-19 BinaxNow (Completed)

## 2021-11-14 DIAGNOSIS — D229 Melanocytic nevi, unspecified: Secondary | ICD-10-CM | POA: Diagnosis not present

## 2021-11-14 DIAGNOSIS — C44612 Basal cell carcinoma of skin of right upper limb, including shoulder: Secondary | ICD-10-CM | POA: Diagnosis not present

## 2021-11-14 DIAGNOSIS — Z85828 Personal history of other malignant neoplasm of skin: Secondary | ICD-10-CM | POA: Diagnosis not present

## 2021-11-14 DIAGNOSIS — L570 Actinic keratosis: Secondary | ICD-10-CM | POA: Diagnosis not present

## 2021-11-14 DIAGNOSIS — L820 Inflamed seborrheic keratosis: Secondary | ICD-10-CM | POA: Diagnosis not present

## 2021-11-14 DIAGNOSIS — D492 Neoplasm of unspecified behavior of bone, soft tissue, and skin: Secondary | ICD-10-CM | POA: Diagnosis not present

## 2021-11-14 DIAGNOSIS — L814 Other melanin hyperpigmentation: Secondary | ICD-10-CM | POA: Diagnosis not present

## 2021-12-11 DIAGNOSIS — M8589 Other specified disorders of bone density and structure, multiple sites: Secondary | ICD-10-CM | POA: Diagnosis not present

## 2021-12-11 DIAGNOSIS — Z1231 Encounter for screening mammogram for malignant neoplasm of breast: Secondary | ICD-10-CM | POA: Diagnosis not present

## 2021-12-11 DIAGNOSIS — Z780 Asymptomatic menopausal state: Secondary | ICD-10-CM | POA: Diagnosis not present

## 2021-12-11 LAB — HM DEXA SCAN

## 2021-12-11 LAB — HM MAMMOGRAPHY

## 2021-12-12 DIAGNOSIS — C44612 Basal cell carcinoma of skin of right upper limb, including shoulder: Secondary | ICD-10-CM | POA: Diagnosis not present

## 2021-12-12 DIAGNOSIS — C4491 Basal cell carcinoma of skin, unspecified: Secondary | ICD-10-CM | POA: Diagnosis not present

## 2021-12-12 DIAGNOSIS — L570 Actinic keratosis: Secondary | ICD-10-CM | POA: Diagnosis not present

## 2021-12-14 ENCOUNTER — Encounter: Payer: Self-pay | Admitting: Family Medicine

## 2021-12-21 ENCOUNTER — Encounter: Payer: Self-pay | Admitting: Family Medicine

## 2022-01-17 ENCOUNTER — Telehealth: Payer: Self-pay | Admitting: Family Medicine

## 2022-01-17 DIAGNOSIS — E559 Vitamin D deficiency, unspecified: Secondary | ICD-10-CM

## 2022-01-17 DIAGNOSIS — E7800 Pure hypercholesterolemia, unspecified: Secondary | ICD-10-CM

## 2022-01-17 DIAGNOSIS — Z0000 Encounter for general adult medical examination without abnormal findings: Secondary | ICD-10-CM

## 2022-01-17 DIAGNOSIS — R5382 Chronic fatigue, unspecified: Secondary | ICD-10-CM

## 2022-01-17 NOTE — Telephone Encounter (Signed)
-----   Message from Velna Hatchet, RT sent at 01/03/2022  4:43 PM EDT ----- ?Regarding: Lab order for Thursday, 01/18/22 appt ?Patient is scheduled for cpx, please order future labs.  Thanks, Anda Kraft  ? ?

## 2022-01-18 ENCOUNTER — Other Ambulatory Visit (INDEPENDENT_AMBULATORY_CARE_PROVIDER_SITE_OTHER): Payer: Medicare HMO

## 2022-01-18 DIAGNOSIS — Z0000 Encounter for general adult medical examination without abnormal findings: Secondary | ICD-10-CM | POA: Diagnosis not present

## 2022-01-18 DIAGNOSIS — E559 Vitamin D deficiency, unspecified: Secondary | ICD-10-CM | POA: Diagnosis not present

## 2022-01-18 DIAGNOSIS — E7800 Pure hypercholesterolemia, unspecified: Secondary | ICD-10-CM

## 2022-01-18 LAB — CBC WITH DIFFERENTIAL/PLATELET
Basophils Absolute: 0 10*3/uL (ref 0.0–0.1)
Basophils Relative: 0.4 % (ref 0.0–3.0)
Eosinophils Absolute: 0.3 10*3/uL (ref 0.0–0.7)
Eosinophils Relative: 2.4 % (ref 0.0–5.0)
HCT: 38.9 % (ref 36.0–46.0)
Hemoglobin: 13.3 g/dL (ref 12.0–15.0)
Lymphocytes Relative: 21.8 % (ref 12.0–46.0)
Lymphs Abs: 2.4 10*3/uL (ref 0.7–4.0)
MCHC: 34 g/dL (ref 30.0–36.0)
MCV: 90.1 fl (ref 78.0–100.0)
Monocytes Absolute: 0.8 10*3/uL (ref 0.1–1.0)
Monocytes Relative: 7 % (ref 3.0–12.0)
Neutro Abs: 7.4 10*3/uL (ref 1.4–7.7)
Neutrophils Relative %: 68.4 % (ref 43.0–77.0)
Platelets: 362 10*3/uL (ref 150.0–400.0)
RBC: 4.32 Mil/uL (ref 3.87–5.11)
RDW: 13.2 % (ref 11.5–15.5)
WBC: 10.9 10*3/uL — ABNORMAL HIGH (ref 4.0–10.5)

## 2022-01-18 LAB — COMPREHENSIVE METABOLIC PANEL
ALT: 13 U/L (ref 0–35)
AST: 20 U/L (ref 0–37)
Albumin: 4.4 g/dL (ref 3.5–5.2)
Alkaline Phosphatase: 57 U/L (ref 39–117)
BUN: 21 mg/dL (ref 6–23)
CO2: 29 mEq/L (ref 19–32)
Calcium: 9.5 mg/dL (ref 8.4–10.5)
Chloride: 101 mEq/L (ref 96–112)
Creatinine, Ser: 1.08 mg/dL (ref 0.40–1.20)
GFR: 48.56 mL/min — ABNORMAL LOW (ref >60.00)
Glucose, Bld: 99 mg/dL (ref 70–99)
Potassium: 4.3 mEq/L (ref 3.5–5.1)
Sodium: 139 mEq/L (ref 135–145)
Total Bilirubin: 0.7 mg/dL (ref 0.2–1.2)
Total Protein: 6.3 g/dL (ref 6.0–8.3)

## 2022-01-18 LAB — LIPID PANEL
Cholesterol: 212 mg/dL — ABNORMAL HIGH (ref 0–200)
HDL: 79.7 mg/dL (ref >39.00)
LDL Cholesterol: 117 mg/dL — ABNORMAL HIGH (ref 0–99)
NonHDL: 131.85
Total CHOL/HDL Ratio: 3
Triglycerides: 75 mg/dL (ref 0.0–149.0)
VLDL: 15 mg/dL (ref 0.0–40.0)

## 2022-01-18 LAB — VITAMIN D 25 HYDROXY (VIT D DEFICIENCY, FRACTURES): VITD: 45.61 ng/mL (ref 30.00–100.00)

## 2022-01-18 LAB — TSH: TSH: 2.65 u[IU]/mL (ref 0.35–5.50)

## 2022-01-25 ENCOUNTER — Ambulatory Visit (INDEPENDENT_AMBULATORY_CARE_PROVIDER_SITE_OTHER): Payer: Medicare HMO | Admitting: Family Medicine

## 2022-01-25 ENCOUNTER — Encounter: Payer: Self-pay | Admitting: Family Medicine

## 2022-01-25 VITALS — BP 124/68 | HR 76 | Temp 98.0°F | Ht 58.75 in | Wt 110.4 lb

## 2022-01-25 DIAGNOSIS — M8589 Other specified disorders of bone density and structure, multiple sites: Secondary | ICD-10-CM | POA: Diagnosis not present

## 2022-01-25 DIAGNOSIS — E559 Vitamin D deficiency, unspecified: Secondary | ICD-10-CM | POA: Diagnosis not present

## 2022-01-25 DIAGNOSIS — E7800 Pure hypercholesterolemia, unspecified: Secondary | ICD-10-CM

## 2022-01-25 DIAGNOSIS — N819 Female genital prolapse, unspecified: Secondary | ICD-10-CM

## 2022-01-25 DIAGNOSIS — J209 Acute bronchitis, unspecified: Secondary | ICD-10-CM | POA: Diagnosis not present

## 2022-01-25 DIAGNOSIS — Z0000 Encounter for general adult medical examination without abnormal findings: Secondary | ICD-10-CM | POA: Diagnosis not present

## 2022-01-25 NOTE — Assessment & Plan Note (Signed)
Disc goals for lipids and reasons to control them ?Rev last labs with pt ?Rev low sat fat diet in detail ?Stable with LDL of 117 and excellent HDL of 79.7 ?

## 2022-01-25 NOTE — Assessment & Plan Note (Signed)
In tx range at 45 ?Vitamin D level is therapeutic with current supplementation ?Disc importance of this to bone and overall health ?Will continue current supplementation  ? ?

## 2022-01-25 NOTE — Assessment & Plan Note (Signed)
dexa 11/2021 improved ?Sees gyn  ?No falls or fx ?Taking alendronate for 2nd course since 2021 and tol well  ?On ca and D ?D level tx  ?Good exercise ?

## 2022-01-25 NOTE — Assessment & Plan Note (Signed)
Reviewed health habits including diet and exercise and skin cancer prevention ?Reviewed appropriate screening tests for age  ?Also reviewed health mt list, fam hx and immunization status , as well as social and family history   ?Colonoscopy is up-to-date from 2018 with no recommendation for another, patient declines other colon cancer screening ?Mammogram is up-to-date from 2 of 2023 ?Patient continues GYN care with Dr. Gertie Fey ?Immunizations are up-to-date ?DEXA is up-to-date from 2 of 2023 with some improvement, taking alendronate for osteopenia, no falls or fractures, taking vitamin D and calcium with good exercise ?Dermatology follow-up is up-to-date with history of basal cell cancer ?Uses sun protection ?Advanced directive is up-to-date ?No cognitive concerns ?Hearing skin screen reviewed, patient does not wish for audiology evaluation, has an issue with a curved ear canal in the screening tool does not work well for her ?Up-to-date with vision and eye care ?PHQ score of 0 ?No help needed for ADLs, great functionality ?

## 2022-01-25 NOTE — Assessment & Plan Note (Signed)
Continues pessary ?Sees gyn  ?Also est cream ?
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
## 2022-01-25 NOTE — Patient Instructions (Addendum)
For allergies ?Zyrtec 10 mg daily at bedtime  ?Flonase nasal spray daily during the season is helpful  ? ?If cough worsens let us know  ?Watch for wheezing  ? ?Take care of yourself  ? ?Keep up the good work with diet and exercise  ? ? ?

## 2022-01-25 NOTE — Assessment & Plan Note (Signed)
Much improved ?Some residual cough/more likely from allergies ?Reassuring exam  ?Has tessalon ?Discussed allergy tx  ?

## 2022-02-19 DIAGNOSIS — Z6822 Body mass index (BMI) 22.0-22.9, adult: Secondary | ICD-10-CM | POA: Diagnosis not present

## 2022-02-19 DIAGNOSIS — Z01419 Encounter for gynecological examination (general) (routine) without abnormal findings: Secondary | ICD-10-CM | POA: Diagnosis not present

## 2022-02-19 DIAGNOSIS — R87610 Atypical squamous cells of undetermined significance on cytologic smear of cervix (ASC-US): Secondary | ICD-10-CM | POA: Diagnosis not present

## 2022-02-19 DIAGNOSIS — N952 Postmenopausal atrophic vaginitis: Secondary | ICD-10-CM | POA: Diagnosis not present

## 2022-02-19 DIAGNOSIS — N819 Female genital prolapse, unspecified: Secondary | ICD-10-CM | POA: Diagnosis not present

## 2022-02-19 DIAGNOSIS — Z124 Encounter for screening for malignant neoplasm of cervix: Secondary | ICD-10-CM | POA: Diagnosis not present

## 2022-03-06 ENCOUNTER — Telehealth: Payer: Self-pay | Admitting: Family Medicine

## 2022-03-06 DIAGNOSIS — Z8349 Family history of other endocrine, nutritional and metabolic diseases: Secondary | ICD-10-CM

## 2022-03-06 NOTE — Telephone Encounter (Signed)
I think that would require a referral to genetics, is that ok with her?

## 2022-03-06 NOTE — Telephone Encounter (Signed)
Pt called and said that her nephew just found out he has hemochromatosis and that in his case it was inherited through his genes for the condition, pt said she would like to get a test for this done as well to see if she is a carrier. Callback is [phone]

## 2022-03-07 NOTE — Telephone Encounter (Signed)
Left VM requesting pt to call the office back 

## 2022-03-08 DIAGNOSIS — Z8349 Family history of other endocrine, nutritional and metabolic diseases: Secondary | ICD-10-CM | POA: Insufficient documentation

## 2022-03-08 NOTE — Telephone Encounter (Signed)
Pt is fine with a referral to genetics. No preference on where to go

## 2022-03-08 NOTE — Telephone Encounter (Signed)
I put the referral in  Let us know if she does not get a call in 1-2 weeks

## 2022-03-12 ENCOUNTER — Telehealth: Payer: Self-pay | Admitting: Family Medicine

## 2022-03-14 NOTE — Telephone Encounter (Signed)
Pt notified of Dr. Marliss Coots comments and appt scheduled tomorrow

## 2022-03-14 NOTE — Telephone Encounter (Signed)
Yes-they reached out to tell me they don't do that testing.   Please have her f/u with me to discuss further  We would usually start with tests for her ferritin level to see if she has the condition before doing genetic testing If genetic testing is needed, unsure if it would be covered by her insurance or if we would be able to do here   We can discuss in more detail at her visit

## 2022-03-14 NOTE — Telephone Encounter (Signed)
Patient called in asking about the referral, the place we referred her to does not do genetic testing.

## 2022-03-15 ENCOUNTER — Ambulatory Visit (INDEPENDENT_AMBULATORY_CARE_PROVIDER_SITE_OTHER): Payer: Medicare HMO | Admitting: Family Medicine

## 2022-03-15 ENCOUNTER — Encounter: Payer: Self-pay | Admitting: Family Medicine

## 2022-03-15 VITALS — BP 118/66 | HR 69 | Temp 97.5°F | Resp 16 | Ht 58.75 in | Wt 111.1 lb

## 2022-03-15 DIAGNOSIS — Z8349 Family history of other endocrine, nutritional and metabolic diseases: Secondary | ICD-10-CM | POA: Diagnosis not present

## 2022-03-15 LAB — FERRITIN: Ferritin: 79.1 ng/mL (ref 10.0–291.0)

## 2022-03-15 LAB — IRON: Iron: 89 ug/dL (ref 42–145)

## 2022-03-15 NOTE — Assessment & Plan Note (Signed)
Nephew has it  His mother /pt sister is deceased but unsure if she could have had it  Pt is curious about carrier status  Disease discussed today Marlana Salvage has no symptoms currently  Lab for ferritin/iron today  Also hemachromatosis DNA-PCR-pending result  Pt aware we do not know status of insurance coverage
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
## 2022-03-15 NOTE — Patient Instructions (Addendum)
Take care of yourself   Labs today for iron/iron stores and the gene for hematosis   If you develop any symptoms let me know

## 2022-03-27 LAB — HEMOCHROMATOSIS DNA-PCR(C282Y,H63D)

## 2022-03-28 ENCOUNTER — Other Ambulatory Visit: Payer: Self-pay | Admitting: Family Medicine

## 2022-03-28 NOTE — Telephone Encounter (Signed)
Refill request Fosamax Last refill 10/17/21 #12/1 Last office visit 03/15/22

## 2022-04-19 DIAGNOSIS — H524 Presbyopia: Secondary | ICD-10-CM | POA: Diagnosis not present

## 2022-04-19 DIAGNOSIS — H5211 Myopia, right eye: Secondary | ICD-10-CM | POA: Diagnosis not present

## 2022-04-19 DIAGNOSIS — Z961 Presence of intraocular lens: Secondary | ICD-10-CM | POA: Diagnosis not present

## 2022-04-23 DIAGNOSIS — L821 Other seborrheic keratosis: Secondary | ICD-10-CM | POA: Diagnosis not present

## 2022-04-23 DIAGNOSIS — L814 Other melanin hyperpigmentation: Secondary | ICD-10-CM | POA: Diagnosis not present

## 2022-04-23 DIAGNOSIS — D229 Melanocytic nevi, unspecified: Secondary | ICD-10-CM | POA: Diagnosis not present

## 2022-04-23 DIAGNOSIS — L820 Inflamed seborrheic keratosis: Secondary | ICD-10-CM | POA: Diagnosis not present

## 2022-04-23 DIAGNOSIS — Z85828 Personal history of other malignant neoplasm of skin: Secondary | ICD-10-CM | POA: Diagnosis not present

## 2022-04-23 DIAGNOSIS — L570 Actinic keratosis: Secondary | ICD-10-CM | POA: Diagnosis not present

## 2022-10-22 DIAGNOSIS — L814 Other melanin hyperpigmentation: Secondary | ICD-10-CM | POA: Diagnosis not present

## 2022-10-22 DIAGNOSIS — D229 Melanocytic nevi, unspecified: Secondary | ICD-10-CM | POA: Diagnosis not present

## 2022-10-22 DIAGNOSIS — L853 Xerosis cutis: Secondary | ICD-10-CM | POA: Diagnosis not present

## 2022-10-22 DIAGNOSIS — Z85828 Personal history of other malignant neoplasm of skin: Secondary | ICD-10-CM | POA: Diagnosis not present

## 2022-10-22 DIAGNOSIS — L570 Actinic keratosis: Secondary | ICD-10-CM | POA: Diagnosis not present

## 2022-10-22 DIAGNOSIS — L578 Other skin changes due to chronic exposure to nonionizing radiation: Secondary | ICD-10-CM | POA: Diagnosis not present

## 2022-10-22 DIAGNOSIS — L821 Other seborrheic keratosis: Secondary | ICD-10-CM | POA: Diagnosis not present

## 2022-10-22 DIAGNOSIS — L820 Inflamed seborrheic keratosis: Secondary | ICD-10-CM | POA: Diagnosis not present

## 2022-12-17 DIAGNOSIS — Z1231 Encounter for screening mammogram for malignant neoplasm of breast: Secondary | ICD-10-CM | POA: Diagnosis not present

## 2022-12-17 LAB — HM MAMMOGRAPHY

## 2023-01-20 ENCOUNTER — Telehealth: Payer: Self-pay | Admitting: Family Medicine

## 2023-01-20 DIAGNOSIS — R5382 Chronic fatigue, unspecified: Secondary | ICD-10-CM

## 2023-01-20 DIAGNOSIS — E7800 Pure hypercholesterolemia, unspecified: Secondary | ICD-10-CM

## 2023-01-20 DIAGNOSIS — R7303 Prediabetes: Secondary | ICD-10-CM | POA: Insufficient documentation

## 2023-01-20 DIAGNOSIS — Z8349 Family history of other endocrine, nutritional and metabolic diseases: Secondary | ICD-10-CM

## 2023-01-20 DIAGNOSIS — Z131 Encounter for screening for diabetes mellitus: Secondary | ICD-10-CM | POA: Insufficient documentation

## 2023-01-20 DIAGNOSIS — Z85528 Personal history of other malignant neoplasm of kidney: Secondary | ICD-10-CM

## 2023-01-20 DIAGNOSIS — E559 Vitamin D deficiency, unspecified: Secondary | ICD-10-CM

## 2023-01-20 NOTE — Telephone Encounter (Signed)
-----   Message from Alvina Chou sent at 01/03/2023 10:51 AM EDT ----- Regarding: Lab orders for Monday, 4.8.24 Patient is scheduled for CPX labs, please order future labs, Thanks , Camelia Eng

## 2023-01-21 ENCOUNTER — Other Ambulatory Visit (INDEPENDENT_AMBULATORY_CARE_PROVIDER_SITE_OTHER): Payer: Medicare HMO

## 2023-01-21 DIAGNOSIS — E7800 Pure hypercholesterolemia, unspecified: Secondary | ICD-10-CM | POA: Diagnosis not present

## 2023-01-21 DIAGNOSIS — E559 Vitamin D deficiency, unspecified: Secondary | ICD-10-CM

## 2023-01-21 DIAGNOSIS — R5382 Chronic fatigue, unspecified: Secondary | ICD-10-CM | POA: Diagnosis not present

## 2023-01-21 DIAGNOSIS — Z131 Encounter for screening for diabetes mellitus: Secondary | ICD-10-CM | POA: Diagnosis not present

## 2023-01-21 DIAGNOSIS — Z85528 Personal history of other malignant neoplasm of kidney: Secondary | ICD-10-CM

## 2023-01-21 LAB — CBC WITH DIFFERENTIAL/PLATELET
Basophils Absolute: 0.1 10*3/uL (ref 0.0–0.1)
Basophils Relative: 0.8 % (ref 0.0–3.0)
Eosinophils Absolute: 0.3 10*3/uL (ref 0.0–0.7)
Eosinophils Relative: 3.4 % (ref 0.0–5.0)
HCT: 40.8 % (ref 36.0–46.0)
Hemoglobin: 14.1 g/dL (ref 12.0–15.0)
Lymphocytes Relative: 26.2 % (ref 12.0–46.0)
Lymphs Abs: 1.9 10*3/uL (ref 0.7–4.0)
MCHC: 34.4 g/dL (ref 30.0–36.0)
MCV: 91.2 fl (ref 78.0–100.0)
Monocytes Absolute: 0.9 10*3/uL (ref 0.1–1.0)
Monocytes Relative: 12.1 % — ABNORMAL HIGH (ref 3.0–12.0)
Neutro Abs: 4.2 10*3/uL (ref 1.4–7.7)
Neutrophils Relative %: 57.5 % (ref 43.0–77.0)
Platelets: 306 10*3/uL (ref 150.0–400.0)
RBC: 4.48 Mil/uL (ref 3.87–5.11)
RDW: 12.8 % (ref 11.5–15.5)
WBC: 7.4 10*3/uL (ref 4.0–10.5)

## 2023-01-21 LAB — LIPID PANEL
Cholesterol: 255 mg/dL — ABNORMAL HIGH (ref 0–200)
HDL: 107.4 mg/dL (ref >39.00)
LDL Cholesterol: 135 mg/dL — ABNORMAL HIGH (ref 0–99)
NonHDL: 147.23
Total CHOL/HDL Ratio: 2
Triglycerides: 63 mg/dL (ref 0.0–149.0)
VLDL: 12.6 mg/dL (ref 0.0–40.0)

## 2023-01-21 LAB — COMPREHENSIVE METABOLIC PANEL
ALT: 19 U/L (ref 0–35)
AST: 24 U/L (ref 0–37)
Albumin: 4.7 g/dL (ref 3.5–5.2)
Alkaline Phosphatase: 52 U/L (ref 39–117)
BUN: 21 mg/dL (ref 6–23)
CO2: 30 mEq/L (ref 19–32)
Calcium: 10.3 mg/dL (ref 8.4–10.5)
Chloride: 99 mEq/L (ref 96–112)
Creatinine, Ser: 1.24 mg/dL — ABNORMAL HIGH (ref 0.40–1.20)
GFR: 40.85 mL/min — ABNORMAL LOW (ref >60.00)
Glucose, Bld: 95 mg/dL (ref 70–99)
Potassium: 4.6 mEq/L (ref 3.5–5.1)
Sodium: 137 mEq/L (ref 135–145)
Total Bilirubin: 0.6 mg/dL (ref 0.2–1.2)
Total Protein: 6.7 g/dL (ref 6.0–8.3)

## 2023-01-21 LAB — VITAMIN D 25 HYDROXY (VIT D DEFICIENCY, FRACTURES): VITD: 44.17 ng/mL (ref 30.00–100.00)

## 2023-01-21 LAB — HEMOGLOBIN A1C: Hgb A1c MFr Bld: 5.9 % (ref 4.6–6.5)

## 2023-01-21 LAB — TSH: TSH: 3.28 u[IU]/mL (ref 0.35–5.50)

## 2023-01-28 ENCOUNTER — Encounter: Payer: Self-pay | Admitting: Family Medicine

## 2023-01-28 ENCOUNTER — Ambulatory Visit (INDEPENDENT_AMBULATORY_CARE_PROVIDER_SITE_OTHER): Payer: Medicare HMO | Admitting: Family Medicine

## 2023-01-28 VITALS — BP 131/70 | HR 69 | Temp 97.2°F | Ht 58.5 in | Wt 111.1 lb

## 2023-01-28 DIAGNOSIS — Z131 Encounter for screening for diabetes mellitus: Secondary | ICD-10-CM

## 2023-01-28 DIAGNOSIS — E559 Vitamin D deficiency, unspecified: Secondary | ICD-10-CM | POA: Diagnosis not present

## 2023-01-28 DIAGNOSIS — M8589 Other specified disorders of bone density and structure, multiple sites: Secondary | ICD-10-CM

## 2023-01-28 DIAGNOSIS — Z85528 Personal history of other malignant neoplasm of kidney: Secondary | ICD-10-CM

## 2023-01-28 DIAGNOSIS — E7800 Pure hypercholesterolemia, unspecified: Secondary | ICD-10-CM | POA: Diagnosis not present

## 2023-01-28 DIAGNOSIS — Z0000 Encounter for general adult medical examination without abnormal findings: Secondary | ICD-10-CM | POA: Diagnosis not present

## 2023-01-28 DIAGNOSIS — N819 Female genital prolapse, unspecified: Secondary | ICD-10-CM

## 2023-01-28 MED ORDER — ALENDRONATE SODIUM 70 MG PO TABS: ORAL_TABLET | ORAL | 3 refills | Status: DC

## 2023-01-28 NOTE — Assessment & Plan Note (Signed)
Taking alendronate since 2022  Tolerates well  No falls or fx Great exercise and habits D level is tx   Enc to continue strength training

## 2023-01-28 NOTE — Assessment & Plan Note (Signed)
Reviewed health habits including diet and exercise and skin cancer prevention Reviewed appropriate screening tests for age  Also reviewed health mt list, fam hx and immunization status , as well as social and family history   See HPI Labs reviewed and ordered Plans to get tetanus shot in pharmacy Mammogram utd 12/2022  Gyn care utd Dexa utd 11/2021 on alendronate no falls or fx  Colonoscopy was 2018  , declines other colon screening  Utd with derm care PHQ of 0
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
## 2023-01-28 NOTE — Assessment & Plan Note (Signed)
Disc goals for lipids and reasons to control them Rev last labs with pt Rev low sat fat diet in detail  LDL up to 135 but HDL is up more to 107.4  Ratio is improved She was eating more butter for a while and that has stopped  Good exercise

## 2023-01-28 NOTE — Assessment & Plan Note (Signed)
Uses pessary with estrogen vaginal cream Under care of gyn

## 2023-01-28 NOTE — Assessment & Plan Note (Signed)
Her oncologist signed off after 5 years  No recurrence or problems

## 2023-01-28 NOTE — Patient Instructions (Addendum)
If you want to update your tetanus shot - pharmacy or health dept    Add some strength training to your routine, this is important for bone and brain health and can reduce your risk of falls and help your body use insulin properly and regulate weight  Light weights, exercise bands , and internet videos are a good way to start  Yoga (chair or regular), machines , floor exercises or a gym with machines are also good options    Keep up the good work with healthy diet and exercise  Take care of yourself    Keep up a good fluid intake

## 2023-02-26 DIAGNOSIS — Z01419 Encounter for gynecological examination (general) (routine) without abnormal findings: Secondary | ICD-10-CM | POA: Diagnosis not present

## 2023-02-26 DIAGNOSIS — N952 Postmenopausal atrophic vaginitis: Secondary | ICD-10-CM | POA: Diagnosis not present

## 2023-02-26 DIAGNOSIS — Z6823 Body mass index (BMI) 23.0-23.9, adult: Secondary | ICD-10-CM | POA: Diagnosis not present

## 2023-02-26 DIAGNOSIS — N819 Female genital prolapse, unspecified: Secondary | ICD-10-CM | POA: Diagnosis not present

## 2023-04-16 NOTE — Patient Instructions (Incomplete)
Christina Henry , Thank you for taking time to come for your Medicare Wellness Visit. I appreciate your ongoing commitment to your health goals. Please review the following plan we discussed and let me know if I can assist you in the future.   These are the goals we discussed:  Goals      Remain active and independent        This is a list of the screening recommended for you and due dates:  Health Maintenance  Topic Date Due   COVID-19 Vaccine (6 - 2023-24 season) 08/28/2022   Flu Shot  05/16/2023   Mammogram  12/17/2023   Medicare Annual Wellness Visit  04/16/2024   DTaP/Tdap/Td vaccine (3 - Td or Tdap) 01/28/2033   Pneumonia Vaccine  Completed   DEXA scan (bone density measurement)  Completed   Zoster (Shingles) Vaccine  Completed   HPV Vaccine  Aged Out   Colon Cancer Screening  Discontinued    Advanced directives: We have a copy of your advanced directives available in your record should your provider ever need to access them.  Conditions/risks identified: Aim for 30 minutes of exercise or brisk walking, 6-8 glasses of water, and 5 servings of fruits and vegetables each day.  Next appointment: Follow up in one year for your annual wellness visit    Preventive Care 65 Years and Older, Female Preventive care refers to lifestyle choices and visits with your health care provider that can promote health and wellness. What does preventive care include? A yearly physical exam. This is also called an annual well check. Dental exams once or twice a year. Routine eye exams. Ask your health care provider how often you should have your eyes checked. Personal lifestyle choices, including: Daily care of your teeth and gums. Regular physical activity. Eating a healthy diet. Avoiding tobacco and drug use. Limiting alcohol use. Practicing safe sex. Taking low-dose aspirin every day. Taking vitamin and mineral supplements as recommended by your health care provider. What happens during  an annual well check? The services and screenings done by your health care provider during your annual well check will depend on your age, overall health, lifestyle risk factors, and family history of disease. Counseling  Your health care provider may ask you questions about your: Alcohol use. Tobacco use. Drug use. Emotional well-being. Home and relationship well-being. Sexual activity. Eating habits. History of falls. Memory and ability to understand (cognition). Work and work Astronomer. Reproductive health. Screening  You may have the following tests or measurements: Height, weight, and BMI. Blood pressure. Lipid and cholesterol levels. These may be checked every 5 years, or more frequently if you are over 40 years old. Skin check. Lung cancer screening. You may have this screening every year starting at age 71 if you have a 30-pack-year history of smoking and currently smoke or have quit within the past 15 years. Fecal occult blood test (FOBT) of the stool. You may have this test every year starting at age 18. Flexible sigmoidoscopy or colonoscopy. You may have a sigmoidoscopy every 5 years or a colonoscopy every 10 years starting at age 6. Hepatitis C blood test. Hepatitis B blood test. Sexually transmitted disease (STD) testing. Diabetes screening. This is done by checking your blood sugar (glucose) after you have not eaten for a while (fasting). You may have this done every 1-3 years. Bone density scan. This is done to screen for osteoporosis. You may have this done starting at age 59. Mammogram. This may be done  every 1-2 years. Talk to your health care provider about how often you should have regular mammograms. Talk with your health care provider about your test results, treatment options, and if necessary, the need for more tests. Vaccines  Your health care provider may recommend certain vaccines, such as: Influenza vaccine. This is recommended every year. Tetanus,  diphtheria, and acellular pertussis (Tdap, Td) vaccine. You may need a Td booster every 10 years. Zoster vaccine. You may need this after age 50. Pneumococcal 13-valent conjugate (PCV13) vaccine. One dose is recommended after age 6. Pneumococcal polysaccharide (PPSV23) vaccine. One dose is recommended after age 4. Talk to your health care provider about which screenings and vaccines you need and how often you need them. This information is not intended to replace advice given to you by your health care provider. Make sure you discuss any questions you have with your health care provider. Document Released: 10/28/2015 Document Revised: 06/20/2016 Document Reviewed: 08/02/2015 Elsevier Interactive Patient Education  2017 [employer] Prevention in the Home Falls can cause injuries. They can happen to people of all ages. There are many things you can do to make your home safe and to help prevent falls. What can I do on the outside of my home? Regularly fix the edges of walkways and driveways and fix any cracks. Remove anything that might make you trip as you walk through a door, such as a raised step or threshold. Trim any bushes or trees on the path to your home. Use bright outdoor lighting. Clear any walking paths of anything that might make someone trip, such as rocks or tools. Regularly check to see if handrails are loose or broken. Make sure that both sides of any steps have handrails. Any raised decks and porches should have guardrails on the edges. Have any leaves, snow, or ice cleared regularly. Use sand or salt on walking paths during winter. Clean up any spills in your garage right away. This includes oil or grease spills. What can I do in the bathroom? Use night lights. Install grab bars by the toilet and in the tub and shower. Do not use towel bars as grab bars. Use non-skid mats or decals in the tub or shower. If you need to sit down in the shower, use a plastic,  non-slip stool. Keep the floor dry. Clean up any water that spills on the floor as soon as it happens. Remove soap buildup in the tub or shower regularly. Attach bath mats securely with double-sided non-slip rug tape. Do not have throw rugs and other things on the floor that can make you trip. What can I do in the bedroom? Use night lights. Make sure that you have a light by your bed that is easy to reach. Do not use any sheets or blankets that are too big for your bed. They should not hang down onto the floor. Have a firm chair that has side arms. You can use this for support while you get dressed. Do not have throw rugs and other things on the floor that can make you trip. What can I do in the kitchen? Clean up any spills right away. Avoid walking on wet floors. Keep items that you use a lot in easy-to-reach places. If you need to reach something above you, use a strong step stool that has a grab bar. Keep electrical cords out of the way. Do not use floor polish or wax that makes floors slippery. If you must use wax, use  non-skid floor wax. Do not have throw rugs and other things on the floor that can make you trip. What can I do with my stairs? Do not leave any items on the stairs. Make sure that there are handrails on both sides of the stairs and use them. Fix handrails that are broken or loose. Make sure that handrails are as long as the stairways. Check any carpeting to make sure that it is firmly attached to the stairs. Fix any carpet that is loose or worn. Avoid having throw rugs at the top or bottom of the stairs. If you do have throw rugs, attach them to the floor with carpet tape. Make sure that you have a light switch at the top of the stairs and the bottom of the stairs. If you do not have them, ask someone to add them for you. What else can I do to help prevent falls? Wear shoes that: Do not have high heels. Have rubber bottoms. Are comfortable and fit you well. Are closed  at the toe. Do not wear sandals. If you use a stepladder: Make sure that it is fully opened. Do not climb a closed stepladder. Make sure that both sides of the stepladder are locked into place. Ask someone to hold it for you, if possible. Clearly mark and make sure that you can see: Any grab bars or handrails. First and last steps. Where the edge of each step is. Use tools that help you move around (mobility aids) if they are needed. These include: Canes. Walkers. Scooters. Crutches. Turn on the lights when you go into a dark area. Replace any light bulbs as soon as they burn out. Set up your furniture so you have a clear path. Avoid moving your furniture around. If any of your floors are uneven, fix them. If there are any pets around you, be aware of where they are. Review your medicines with your doctor. Some medicines can make you feel dizzy. This can increase your chance of falling. Ask your doctor what other things that you can do to help prevent falls. This information is not intended to replace advice given to you by your health care provider. Make sure you discuss any questions you have with your health care provider. Document Released: 07/28/2009 Document Revised: 03/08/2016 Document Reviewed: 11/05/2014 Elsevier Interactive Patient Education  2017 [employer].
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
## 2023-04-17 ENCOUNTER — Ambulatory Visit (INDEPENDENT_AMBULATORY_CARE_PROVIDER_SITE_OTHER): Payer: Medicare HMO

## 2023-04-17 VITALS — Ht 58.5 in | Wt 111.0 lb

## 2023-04-17 DIAGNOSIS — Z0000 Encounter for general adult medical examination without abnormal findings: Secondary | ICD-10-CM | POA: Diagnosis not present

## 2023-04-19 ENCOUNTER — Telehealth: Payer: Self-pay | Admitting: *Deleted

## 2023-04-19 NOTE — Telephone Encounter (Signed)
-----   Message from Judy Pimple, MD sent at 04/18/2023  3:39 PM EDT ----- Pt mentioned worse back pain to the nurse at [facility]  I would like ot have her follow up for in office visit and we can do xray at that time

## 2023-04-19 NOTE — Telephone Encounter (Signed)
Lvm for patient tcb and schedule 

## 2023-04-19 NOTE — Telephone Encounter (Signed)
Patient has been scheduled

## 2023-04-19 NOTE — Telephone Encounter (Signed)
See Dr. Royden Purl prev message. Please schedule appt

## 2023-04-29 DIAGNOSIS — L570 Actinic keratosis: Secondary | ICD-10-CM | POA: Diagnosis not present

## 2023-04-29 DIAGNOSIS — L853 Xerosis cutis: Secondary | ICD-10-CM | POA: Diagnosis not present

## 2023-04-29 DIAGNOSIS — L918 Other hypertrophic disorders of the skin: Secondary | ICD-10-CM | POA: Diagnosis not present

## 2023-04-29 DIAGNOSIS — D229 Melanocytic nevi, unspecified: Secondary | ICD-10-CM | POA: Diagnosis not present

## 2023-04-29 DIAGNOSIS — L821 Other seborrheic keratosis: Secondary | ICD-10-CM | POA: Diagnosis not present

## 2023-04-29 DIAGNOSIS — L578 Other skin changes due to chronic exposure to nonionizing radiation: Secondary | ICD-10-CM | POA: Diagnosis not present

## 2023-04-29 DIAGNOSIS — Z85828 Personal history of other malignant neoplasm of skin: Secondary | ICD-10-CM | POA: Diagnosis not present

## 2023-04-29 DIAGNOSIS — L814 Other melanin hyperpigmentation: Secondary | ICD-10-CM | POA: Diagnosis not present

## 2023-04-29 DIAGNOSIS — L820 Inflamed seborrheic keratosis: Secondary | ICD-10-CM | POA: Diagnosis not present

## 2023-04-29 DIAGNOSIS — L658 Other specified nonscarring hair loss: Secondary | ICD-10-CM | POA: Diagnosis not present

## 2023-04-30 ENCOUNTER — Ambulatory Visit (INDEPENDENT_AMBULATORY_CARE_PROVIDER_SITE_OTHER)
Admission: RE | Admit: 2023-04-30 | Discharge: 2023-04-30 | Disposition: A | Discharge status: Home / Self Care | Payer: Medicare HMO | Source: Ambulatory Visit | Attending: Family Medicine | Admitting: Family Medicine

## 2023-04-30 ENCOUNTER — Encounter: Payer: Self-pay | Admitting: Family Medicine

## 2023-04-30 ENCOUNTER — Ambulatory Visit (INDEPENDENT_AMBULATORY_CARE_PROVIDER_SITE_OTHER): Payer: Medicare HMO | Admitting: Family Medicine

## 2023-04-30 VITALS — BP 132/75 | HR 72 | Temp 97.7°F | Ht 58.5 in | Wt 114.0 lb

## 2023-04-30 DIAGNOSIS — M5386 Other specified dorsopathies, lumbar region: Secondary | ICD-10-CM

## 2023-04-30 DIAGNOSIS — M25572 Pain in left ankle and joints of left foot: Secondary | ICD-10-CM | POA: Insufficient documentation

## 2023-04-30 DIAGNOSIS — M545 Low back pain, unspecified: Secondary | ICD-10-CM | POA: Diagnosis not present

## 2023-04-30 DIAGNOSIS — G8929 Other chronic pain: Secondary | ICD-10-CM | POA: Diagnosis not present

## 2023-04-30 DIAGNOSIS — M47816 Spondylosis without myelopathy or radiculopathy, lumbar region: Secondary | ICD-10-CM | POA: Diagnosis not present

## 2023-04-30 NOTE — Assessment & Plan Note (Addendum)
This is ongoing and intermittent  Worse this week  Exam unchanged Xray ordered- noted some old wedge deformities and disk ht loss and deg change  Known DDD and arthritis  May benefit from PT  Discussed use of heat/stretches
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
## 2023-04-30 NOTE — Patient Instructions (Signed)
Xrays of back and ankle today  Will update with result when read    Try ice on ankle at end of day  Heat on back when tight   Voltaren gel is very helpful as needed over the counter   Take care of yourself  Please let us know if symptoms worsen in meantime

## 2023-04-30 NOTE — Assessment & Plan Note (Signed)
With injury in distant past Possible oa  Xray today -noted evidence of old trauma  Encouraged trial of voltaren gel Continue to tape for exercise  Ice prn

## 2023-05-01 NOTE — Addendum Note (Signed)
Addended by: Roxy Manns A on: 05/01/2023 08:01 PM   Modules accepted: Orders

## 2023-05-06 DIAGNOSIS — H524 Presbyopia: Secondary | ICD-10-CM | POA: Diagnosis not present

## 2023-05-06 DIAGNOSIS — H52223 Regular astigmatism, bilateral: Secondary | ICD-10-CM | POA: Diagnosis not present

## 2023-05-06 DIAGNOSIS — H5211 Myopia, right eye: Secondary | ICD-10-CM | POA: Diagnosis not present

## 2023-05-07 ENCOUNTER — Encounter: Payer: Self-pay | Admitting: *Deleted

## 2023-06-20 DIAGNOSIS — R32 Unspecified urinary incontinence: Secondary | ICD-10-CM | POA: Diagnosis not present

## 2023-06-20 DIAGNOSIS — M810 Age-related osteoporosis without current pathological fracture: Secondary | ICD-10-CM | POA: Diagnosis not present

## 2023-06-20 DIAGNOSIS — Z8249 Family history of ischemic heart disease and other diseases of the circulatory system: Secondary | ICD-10-CM | POA: Diagnosis not present

## 2023-06-20 DIAGNOSIS — E785 Hyperlipidemia, unspecified: Secondary | ICD-10-CM | POA: Diagnosis not present

## 2023-06-20 DIAGNOSIS — M1990 Unspecified osteoarthritis, unspecified site: Secondary | ICD-10-CM | POA: Diagnosis not present

## 2023-06-20 DIAGNOSIS — N952 Postmenopausal atrophic vaginitis: Secondary | ICD-10-CM | POA: Diagnosis not present

## 2023-06-20 DIAGNOSIS — Z79890 Hormone replacement therapy (postmenopausal): Secondary | ICD-10-CM | POA: Diagnosis not present

## 2023-06-20 DIAGNOSIS — M545 Low back pain, unspecified: Secondary | ICD-10-CM | POA: Diagnosis not present

## 2023-06-20 DIAGNOSIS — I10 Essential (primary) hypertension: Secondary | ICD-10-CM | POA: Diagnosis not present

## 2023-06-20 DIAGNOSIS — Z7983 Long term (current) use of bisphosphonates: Secondary | ICD-10-CM | POA: Diagnosis not present

## 2023-06-20 DIAGNOSIS — Z008 Encounter for other general examination: Secondary | ICD-10-CM | POA: Diagnosis not present

## 2023-09-19 ENCOUNTER — Ambulatory Visit (INDEPENDENT_AMBULATORY_CARE_PROVIDER_SITE_OTHER): Payer: Medicare HMO | Admitting: Internal Medicine

## 2023-09-19 VITALS — BP 138/80 | HR 88 | Temp 98.7°F | Ht 58.5 in | Wt 115.0 lb

## 2023-09-19 DIAGNOSIS — J22 Unspecified acute lower respiratory infection: Secondary | ICD-10-CM | POA: Diagnosis not present

## 2023-09-19 MED ORDER — DOXYCYCLINE HYCLATE 100 MG PO TABS: 100.0000 mg | ORAL_TABLET | Freq: Two times a day (BID) | ORAL | 0 refills | Status: DC
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
## 2023-09-19 NOTE — Assessment & Plan Note (Signed)
Persisting over a week Likely secondary sinus infection Will give doxy 100 bid x 7 days

## 2023-12-09 DIAGNOSIS — L820 Inflamed seborrheic keratosis: Secondary | ICD-10-CM | POA: Diagnosis not present

## 2023-12-09 DIAGNOSIS — L570 Actinic keratosis: Secondary | ICD-10-CM | POA: Diagnosis not present

## 2023-12-09 DIAGNOSIS — D229 Melanocytic nevi, unspecified: Secondary | ICD-10-CM | POA: Diagnosis not present

## 2023-12-09 DIAGNOSIS — L821 Other seborrheic keratosis: Secondary | ICD-10-CM | POA: Diagnosis not present

## 2023-12-09 DIAGNOSIS — L814 Other melanin hyperpigmentation: Secondary | ICD-10-CM | POA: Diagnosis not present

## 2023-12-09 DIAGNOSIS — Z85828 Personal history of other malignant neoplasm of skin: Secondary | ICD-10-CM | POA: Diagnosis not present

## 2023-12-23 DIAGNOSIS — Z1231 Encounter for screening mammogram for malignant neoplasm of breast: Secondary | ICD-10-CM | POA: Diagnosis not present

## 2023-12-23 LAB — HM MAMMOGRAPHY

## 2023-12-26 ENCOUNTER — Encounter: Payer: Self-pay | Admitting: Family Medicine

## 2023-12-31 DIAGNOSIS — M8588 Other specified disorders of bone density and structure, other site: Secondary | ICD-10-CM | POA: Diagnosis not present

## 2023-12-31 LAB — HM DEXA SCAN

## 2024-01-01 ENCOUNTER — Encounter: Payer: Self-pay | Admitting: Family Medicine

## 2024-01-02 ENCOUNTER — Encounter: Payer: Self-pay | Admitting: *Deleted

## 2024-01-14 NOTE — Telephone Encounter (Signed)
error 

## 2024-01-20 ENCOUNTER — Telehealth: Payer: Self-pay | Admitting: Family Medicine

## 2024-01-20 DIAGNOSIS — E559 Vitamin D deficiency, unspecified: Secondary | ICD-10-CM

## 2024-01-20 DIAGNOSIS — R7303 Prediabetes: Secondary | ICD-10-CM

## 2024-01-20 DIAGNOSIS — R5382 Chronic fatigue, unspecified: Secondary | ICD-10-CM

## 2024-01-20 DIAGNOSIS — E7800 Pure hypercholesterolemia, unspecified: Secondary | ICD-10-CM

## 2024-01-20 DIAGNOSIS — Z85528 Personal history of other malignant neoplasm of kidney: Secondary | ICD-10-CM

## 2024-01-20 NOTE — Telephone Encounter (Signed)
-----   Message from Alvina Chou sent at 01/03/2024 10:10 AM EDT ----- Regarding: Lab orders for Wed, 4.9.25 Patient is scheduled for CPX labs, please order future labs, Thanks , Camelia Eng

## 2024-01-21 ENCOUNTER — Other Ambulatory Visit: Payer: Self-pay | Admitting: Family Medicine

## 2024-01-22 ENCOUNTER — Other Ambulatory Visit (INDEPENDENT_AMBULATORY_CARE_PROVIDER_SITE_OTHER): Payer: Medicare HMO

## 2024-01-22 DIAGNOSIS — R5382 Chronic fatigue, unspecified: Secondary | ICD-10-CM

## 2024-01-22 DIAGNOSIS — E7800 Pure hypercholesterolemia, unspecified: Secondary | ICD-10-CM

## 2024-01-22 DIAGNOSIS — E559 Vitamin D deficiency, unspecified: Secondary | ICD-10-CM | POA: Diagnosis not present

## 2024-01-22 DIAGNOSIS — R7303 Prediabetes: Secondary | ICD-10-CM

## 2024-01-22 DIAGNOSIS — Z85528 Personal history of other malignant neoplasm of kidney: Secondary | ICD-10-CM

## 2024-01-22 LAB — COMPREHENSIVE METABOLIC PANEL WITH GFR
ALT: 16 U/L (ref 0–35)
AST: 21 U/L (ref 0–37)
Albumin: 5 g/dL (ref 3.5–5.2)
Alkaline Phosphatase: 49 U/L (ref 39–117)
BUN: 20 mg/dL (ref 6–23)
CO2: 29 meq/L (ref 19–32)
Calcium: 9.5 mg/dL (ref 8.4–10.5)
Chloride: 98 meq/L (ref 96–112)
Creatinine, Ser: 1.05 mg/dL (ref 0.40–1.20)
GFR: 49.53 mL/min — ABNORMAL LOW (ref >60.00)
Glucose, Bld: 90 mg/dL (ref 70–99)
Potassium: 4 meq/L (ref 3.5–5.1)
Sodium: 136 meq/L (ref 135–145)
Total Bilirubin: 0.6 mg/dL (ref 0.2–1.2)
Total Protein: 6.8 g/dL (ref 6.0–8.3)

## 2024-01-22 LAB — LIPID PANEL
Cholesterol: 253 mg/dL — ABNORMAL HIGH (ref 0–200)
HDL: 96 mg/dL (ref >39.00)
LDL Cholesterol: 141 mg/dL — ABNORMAL HIGH (ref 0–99)
NonHDL: 156.8
Total CHOL/HDL Ratio: 3
Triglycerides: 79 mg/dL (ref 0.0–149.0)
VLDL: 15.8 mg/dL (ref 0.0–40.0)

## 2024-01-22 LAB — CBC WITH DIFFERENTIAL/PLATELET
Basophils Absolute: 0.1 10*3/uL (ref 0.0–0.1)
Basophils Relative: 1.1 % (ref 0.0–3.0)
Eosinophils Absolute: 0.2 10*3/uL (ref 0.0–0.7)
Eosinophils Relative: 2.3 % (ref 0.0–5.0)
HCT: 40.2 % (ref 36.0–46.0)
Hemoglobin: 14.1 g/dL (ref 12.0–15.0)
Lymphocytes Relative: 21.1 % (ref 12.0–46.0)
Lymphs Abs: 1.8 10*3/uL (ref 0.7–4.0)
MCHC: 34.9 g/dL (ref 30.0–36.0)
MCV: 90.7 fl (ref 78.0–100.0)
Monocytes Absolute: 0.8 10*3/uL (ref 0.1–1.0)
Monocytes Relative: 9.7 % (ref 3.0–12.0)
Neutro Abs: 5.7 10*3/uL (ref 1.4–7.7)
Neutrophils Relative %: 65.8 % (ref 43.0–77.0)
Platelets: 340 10*3/uL (ref 150.0–400.0)
RBC: 4.44 Mil/uL (ref 3.87–5.11)
RDW: 13.2 % (ref 11.5–15.5)
WBC: 8.7 10*3/uL (ref 4.0–10.5)

## 2024-01-22 LAB — TSH: TSH: 3.79 u[IU]/mL (ref 0.35–5.50)

## 2024-01-22 LAB — VITAMIN D 25 HYDROXY (VIT D DEFICIENCY, FRACTURES): VITD: 66.98 ng/mL (ref 30.00–100.00)

## 2024-01-22 LAB — HEMOGLOBIN A1C: Hgb A1c MFr Bld: 5.9 % (ref 4.6–6.5)

## 2024-01-24 ENCOUNTER — Ambulatory Visit (INDEPENDENT_AMBULATORY_CARE_PROVIDER_SITE_OTHER): Payer: Medicare HMO

## 2024-01-24 VITALS — Ht 59.0 in | Wt 115.0 lb

## 2024-01-24 DIAGNOSIS — Z0000 Encounter for general adult medical examination without abnormal findings: Secondary | ICD-10-CM

## 2024-01-24 NOTE — Patient Instructions (Signed)
 [name] , Thank you for taking time to come for your Medicare Wellness Visit. I appreciate your ongoing commitment to your health goals. Please review the following plan we discussed and let me know if I can assist you in the future.   Referrals/Orders/Follow-Ups/Clinician Recommendations: none  This is a list of the screening recommended for you and due dates:  Health Maintenance  Topic Date Due   COVID-19 Vaccine (6 - 2024-25 season) 06/16/2023   Flu Shot  05/15/2024   Mammogram  12/22/2024   Medicare Annual Wellness Visit  01/23/2025   DTaP/Tdap/Td vaccine (3 - Td or Tdap) 01/28/2033   Pneumonia Vaccine  Completed   DEXA scan (bone density measurement)  Completed   Zoster (Shingles) Vaccine  Completed   HPV Vaccine  Aged Out   Meningitis B Vaccine  Aged Out   Colon Cancer Screening  Discontinued    Advanced directives: (In Chart) A copy of your advanced directives are scanned into your chart should your provider ever need it.  Next Medicare Annual Wellness Visit scheduled for next year: Yes 01/24/24 @ 10:10am televisit
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
## 2024-01-29 ENCOUNTER — Ambulatory Visit (INDEPENDENT_AMBULATORY_CARE_PROVIDER_SITE_OTHER): Payer: Medicare HMO | Admitting: Family Medicine

## 2024-01-29 ENCOUNTER — Encounter: Payer: Self-pay | Admitting: Family Medicine

## 2024-01-29 VITALS — BP 130/62 | HR 72 | Temp 98.2°F | Ht 58.5 in | Wt 113.2 lb

## 2024-01-29 DIAGNOSIS — Z85828 Personal history of other malignant neoplasm of skin: Secondary | ICD-10-CM

## 2024-01-29 DIAGNOSIS — E7800 Pure hypercholesterolemia, unspecified: Secondary | ICD-10-CM | POA: Diagnosis not present

## 2024-01-29 DIAGNOSIS — Z0000 Encounter for general adult medical examination without abnormal findings: Secondary | ICD-10-CM

## 2024-01-29 DIAGNOSIS — M8589 Other specified disorders of bone density and structure, multiple sites: Secondary | ICD-10-CM | POA: Diagnosis not present

## 2024-01-29 DIAGNOSIS — E559 Vitamin D deficiency, unspecified: Secondary | ICD-10-CM

## 2024-01-29 DIAGNOSIS — R7303 Prediabetes: Secondary | ICD-10-CM

## 2024-01-29 DIAGNOSIS — R5382 Chronic fatigue, unspecified: Secondary | ICD-10-CM

## 2024-01-29 NOTE — Assessment & Plan Note (Signed)
 No falls or new fracture  Alendronate weekly since 2022  Stable dexa 12/2023 Good exercise habits   Discussed fall prevention, supplements and exercise for bone density  D level is in normal range

## 2024-01-29 NOTE — Patient Instructions (Addendum)
 Stay active Add some strength training to your routine, this is important for bone and brain health and can reduce your risk of falls and help your body use insulin properly and regulate weight  Light weights, exercise bands , and internet videos are a good way to start  Yoga (chair or regular), machines , floor exercises or a gym with machines are also good options    Cholesterol is up  We will continue to watch it  Avoid red meat/ fried foods/ egg yolks/ fatty breakfast meats/ butter, cheese and high fat dairy/ and shellfish   Try to get most of your carbohydrates from produce (with the exception of white potatoes) and whole grains Eat less bread/pasta/rice/snack foods/cereals/sweets and other items from the middle of the grocery store (processed carbs)  Read the handout and let us  know if you are interested in a cardiac calcium scan  Average price is about 200$

## 2024-01-29 NOTE — Assessment & Plan Note (Signed)
 Last vitamin D Lab Results  Component Value Date   VD25OH 66.98 01/22/2024   Vitamin D level is therapeutic with current supplementation Disc importance of this to bone and overall health

## 2024-01-29 NOTE — Assessment & Plan Note (Signed)
 Lab Results  Component Value Date   HGBA1C 5.9 01/22/2024   HGBA1C 5.9 01/21/2023   disc imp of low glycemic diet and wt loss to prevent DM2
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
## 2024-01-29 NOTE — Assessment & Plan Note (Signed)
 Reviewed health habits including diet and exercise and skin cancer prevention Reviewed appropriate screening tests for age  Also reviewed health mt list, fam hx and immunization status , as well as social and family history   See HPI Labs reviewed and ordered Health Maintenance  Topic Date Due   COVID-19 Vaccine (6 - 2024-25 season) 06/16/2023   Flu Shot  05/15/2024   Mammogram  12/22/2024   Medicare Annual Wellness Visit  01/28/2025   DTaP/Tdap/Td vaccine (4 - Td or Tdap) 01/28/2033   Pneumonia Vaccine  Completed   DEXA scan (bone density measurement)  Completed   Zoster (Shingles) Vaccine  Completed   HPV Vaccine  Aged Out   Meningitis B Vaccine  Aged Out   Colon Cancer Screening  Discontinued    Continues gyn care/pessary  Discussed fall prevention, supplements and exercise for bone density  PHQ 0

## 2024-01-29 NOTE — Assessment & Plan Note (Signed)
 Utd with dermatology visits Uses sun protection

## 2024-01-29 NOTE — Assessment & Plan Note (Deleted)
 Above right brow From 4/12 Healing well w/o complication   11 simple interrupted sutures removed and steri strips applied

## 2024-01-29 NOTE — Assessment & Plan Note (Signed)
 Lipids are up despite good diet  Disc goals for lipids and reasons to control them Rev last labs with pt Rev low sat fat diet in detail  Will continue to watch Stable ratio/ good HDL  Consider statin if LDL continues to rise  Handout given Diet discussed   Discussed option of cardiac calcium score as well Handout given Will call if interested  Does not know a lot of fam history since folks died young

## 2024-02-27 DIAGNOSIS — Z01419 Encounter for gynecological examination (general) (routine) without abnormal findings: Secondary | ICD-10-CM | POA: Diagnosis not present

## 2024-02-27 DIAGNOSIS — N814 Uterovaginal prolapse, unspecified: Secondary | ICD-10-CM | POA: Diagnosis not present

## 2024-02-27 DIAGNOSIS — Z6823 Body mass index (BMI) 23.0-23.9, adult: Secondary | ICD-10-CM | POA: Diagnosis not present

## 2024-02-27 DIAGNOSIS — N952 Postmenopausal atrophic vaginitis: Secondary | ICD-10-CM | POA: Diagnosis not present

## 2024-04-12 ENCOUNTER — Other Ambulatory Visit: Payer: Self-pay | Admitting: Family Medicine

## 2024-06-01 DIAGNOSIS — L988 Other specified disorders of the skin and subcutaneous tissue: Secondary | ICD-10-CM | POA: Diagnosis not present

## 2024-06-01 DIAGNOSIS — L821 Other seborrheic keratosis: Secondary | ICD-10-CM | POA: Diagnosis not present

## 2024-06-01 DIAGNOSIS — L820 Inflamed seborrheic keratosis: Secondary | ICD-10-CM | POA: Diagnosis not present

## 2024-06-01 DIAGNOSIS — L814 Other melanin hyperpigmentation: Secondary | ICD-10-CM | POA: Diagnosis not present

## 2024-06-01 DIAGNOSIS — L578 Other skin changes due to chronic exposure to nonionizing radiation: Secondary | ICD-10-CM | POA: Diagnosis not present

## 2024-06-01 DIAGNOSIS — L570 Actinic keratosis: Secondary | ICD-10-CM | POA: Diagnosis not present

## 2024-06-01 DIAGNOSIS — D229 Melanocytic nevi, unspecified: Secondary | ICD-10-CM | POA: Diagnosis not present

## 2024-06-01 DIAGNOSIS — Z85828 Personal history of other malignant neoplasm of skin: Secondary | ICD-10-CM | POA: Diagnosis not present

## 2024-10-21 ENCOUNTER — Encounter: Payer: Self-pay | Admitting: Medical Genetics

## 2024-10-21 ENCOUNTER — Other Ambulatory Visit
Admission: RE | Admit: 2024-10-21 | Discharge: 2024-10-21 | Disposition: A | Discharge status: Home / Self Care | Payer: Self-pay | Source: Ambulatory Visit | Attending: Medical Genetics | Admitting: Medical Genetics

## 2024-10-21 DIAGNOSIS — Z006 Encounter for examination for normal comparison and control in clinical research program: Secondary | ICD-10-CM

## 2024-10-21 NOTE — Research (Signed)
 Study: [place] [employer]   Subject Name: Christina Henry   Participant ADAJA WANDER ([id number]) was identified as a potential candidate for the above listed study. The informed consent documents were provided to the participant mail.   The patient was asked the eligibility questions. Confirmed the participant met eligibility criteria.   A copy of the informed consent form was provided to the subject for review. The patient was provided the option of taking the informed consent documents to review and was encouraged to review at their convenience with their support network, including other care providers.    The patient was provided with the informed consent questionnaire to demonstrate understanding of the informed consent form. The patient was provided with the correct answers to the informed consent questionnaire.    All the participants questions and concerns regarding participation in the study and the informed consent were addressed to the patient's satisfaction.    The patient has agreed to participate in the above-listed research study and has voluntarily signed the informed consent form [[facility] V5 07/31/2024, [facility] V8, 07/06/2024]. The patient was provided with a copy of the signed informed consent form for their reference.   The informed consent was obtained prior to performance of any protocol-specific procedures for the subject. A copy of the signed informed consent will be scanned in the subject's medical record.       Best,  Noemi Bimler, Ph.D. [facility]   [facility] Clinical Research Coordinator Direct Dial: [phone]

## 2024-10-30 LAB — GENECONNECT MOLECULAR SCREEN: Genetic Analysis Overall Interpretation: NEGATIVE

## 2025-01-25 ENCOUNTER — Other Ambulatory Visit

## 2025-01-26 ENCOUNTER — Ambulatory Visit

## 2025-02-01 ENCOUNTER — Encounter: Admitting: Family Medicine
